# Patient Record
Sex: Female | Born: 1950 | Race: White | Hispanic: No | Smoking: Current some day smoker
Health system: Southern US, Community
[De-identification: ages and names within clinical notes are randomized; demographics above are authoritative.]

## PROBLEM LIST (undated history)

## (undated) DIAGNOSIS — R011 Cardiac murmur, unspecified: Secondary | ICD-10-CM

## (undated) DIAGNOSIS — C4359 Malignant melanoma of other part of trunk: Secondary | ICD-10-CM

## (undated) DIAGNOSIS — I10 Essential (primary) hypertension: Secondary | ICD-10-CM

## (undated) HISTORY — PX: MELANOMA EXCISION: SHX5266

## (undated) HISTORY — PX: EYE SURGERY: SHX253

---

## 2004-05-10 ENCOUNTER — Encounter: Admission: RE | Admit: 2004-05-10 | Discharge: 2004-05-10 | Payer: Self-pay | Admitting: Family Medicine

## 2004-07-04 ENCOUNTER — Encounter: Admission: RE | Admit: 2004-07-04 | Discharge: 2004-07-04 | Payer: Self-pay | Admitting: Family Medicine

## 2007-09-17 ENCOUNTER — Encounter: Admission: RE | Admit: 2007-09-17 | Discharge: 2007-09-17 | Payer: Self-pay | Admitting: Family Medicine

## 2007-09-17 ENCOUNTER — Inpatient Hospital Stay (HOSPITAL_COMMUNITY): Admission: EM | Admit: 2007-09-17 | Discharge: 2007-09-21 | Payer: Self-pay | Admitting: Emergency Medicine

## 2007-09-18 ENCOUNTER — Encounter (INDEPENDENT_AMBULATORY_CARE_PROVIDER_SITE_OTHER): Payer: Self-pay | Admitting: Surgery

## 2010-08-13 NOTE — Op Note (Signed)
[name], Heidi Ramirez NO.:  [id number]   MEDICAL RECORD NO.:  [id number]           PATIENT TYPE:   LOCATION:                                 FACILITY:   PHYSICIAN:  Wilmon Arms. Corliss Skains, M.D. DATE OF BIRTH:  10-Oct-1950   DATE OF PROCEDURE:  09/18/2007  DATE OF DISCHARGE:                               OPERATIVE REPORT   PREOP DIAGNOSIS:  Acute perforated appendicitis.   POSTOPERATIVE DIAGNOSIS:  Acute perforated appendicitis.   PROCEDURE PERFORMED:  Laparoscopic appendectomy.   SURGEON:  Wilmon Arms. Tsuei MD   ANESTHESIA:  General.   INDICATIONS:  The patient is a 60 year old female who presents with a 3-  day history of nausea, vomiting, and right lower quadrant pain.  She did  have some fever, but has deprogressed.  She was sent by her primary care  doctor to palpation imaging where a CT scan showed a ruptured appendix.  We were consulted and recommended immediate laparoscopic appendectomy,  possible open appendectomy.   DESCRIPTION OF PROCEDURE:  The patient was brought to the operating  room, placed in the supine position on operating room table.  After an  adequate level of general anesthesia was obtained, a Foley catheter was  placed under sterile technique.  The patient's abdomen was prepped with  Betadine, draped in sterile fashion.  A time out was taken to ensure the  proper patient, proper procedure.  We made a small vertical midline  incision just below her umbilicus.  Dissection was carried down to the  fascia which was opened vertically.  The peritoneum was bluntly entered.  A stay suture of 0 Vicryl was placed under fascial opening.  The Hasson  cannula was inserted and secured with a stay suture.  Pneumoperitoneum  was obtained by insufflating CO2 maintaining maximal pressure of 15  mmHg.  We inserted the laparoscope.  The patient has a previous right  subcostal incision, but has no intra-abdominal adhesions in this area.  A 5-mm port was placed  in the right upper quadrant.  Another 5 mm port  was placed in the left lower quadrant.  There were some purulent seen in  the pelvis.  This was suctioned out.  The patient was positioned in  Trendelenburg tilted to her left.  We identified the cecum which  appeared normal.  We could identify a large inflamed necrotic appendix  heading straight down into the pelvis.  We were able to bluntly  dissected a tip of the appendix free and elevated.  However, there was  already a large hole in the side of the appendix which was draining some  purulent fluid as well as stool.  Difficulty grasping the appendix due  to its necrotic nature.  Therefore, I began dissecting at the base of  the appendix, which appeared grossly normal.  We created a window around  the appendix.  The base of the appendix was then divided with EndoGIA  stapler blue load.  We then divided the mesoappendix with the harmonic  scalpel.  We were able to then free up the entire appendix and placed in  an EndoCatch sac.  The appendix and sac were then removed to the  umbilical port site.  We then spent some time irrigating the pelvis and  right lower quadrant with 3 full liters of normal saline.  There was a  little bit of oozing at the staple line which was treated with cautery.  The fallopian tube was identified and was inflamed, but no gross  purulence was noted.  The uterus appeared grossly normal.  We suctioned  as much irrigation as possible including up over the liver.  Pneumoperitoneum was then released as we removed the ports.  The stay  suture was used to close  the umbilical fascia.  4-0 Monocryl was used to close the skin  incisions.  Steri-Strips and clean dressings were applied.  The patient  was extubated and brought to recovery in stable condition.  All sponge,  instrument, needle counts correct.      Wilmon Arms. Tsuei, M.D.  Electronically Signed     MKT/MEDQ  D:  09/18/2007  T:  09/18/2007  Job:  [id number]

## 2010-08-16 NOTE — Discharge Summary (Signed)
NAMETHERSEA, MANFREDONIA               ACCOUNT NO.:  [id number]   MEDICAL RECORD NO.:  [id number]          PATIENT TYPE:  INP   LOCATION:  6741                         FACILITY:  [facility]   PHYSICIAN:  Gabrielle Dare. Janee Morn, M.D.DATE OF BIRTH:  [birth year]   DATE OF ADMISSION:  09/17/2007  DATE OF DISCHARGE:  09/21/2007                               DISCHARGE SUMMARY   DISCHARGING PHYSICIAN:  Gabrielle Dare. Janee Morn, MD   CHIEF COMPLAINT AND REASON FOR ADMISSION:  Ms. Goding is a 60 year old  female patient with no significant medical problems, who presented to  the ER with 3 days of nausea and vomiting, 2 days of right lower  quadrant pain, saw her primary care physician initially, and was sent to  [facility] where CT revealed a ruptured appendix.  She presented  to the ER.  She had also been having fevers and nausea and vomiting.  On  clinical exam, she was tender in the right lower quadrant.  Her white  count was 10,600.  Her CT scan was reviewed by Dr. Corliss Skains and was  confirmed as being a perforated appendix.  The patient was admitted with  the following diagnoses:  1. Acute abdominal pain secondary to perforated appendix.   HOSPITAL COURSE:  The patient was admitted and taken directly to the OR  from the ER where she underwent a laparoscopic appendectomy for  perforated appendix.  She was found to have a necrotic appendix.  She  was sent back to the general medical floor to cover with IV fluids,  careful monitoring, and return of bowel function and empiric antibiotic  therapy with Zosyn IV.  Over the first few postoperative days, the  patient had issues related to pain control and ileus symptoms.  She was  also splinting and was requiring oxygen in the initial 24-hour period.   On postop day #2, the patient was afebrile, vital signs were stable.  She was sating 94% on room air.  She was having liquid BMs and some  emesis.  This was actually related to the Percocet she has had the day  before.  This Percocet was subsequently changed to Vicodin and IV  Toradol with Protonix was added.  She was continued on her Zosyn.  Her  abdomen was soft and distended, but bowel sounds were noted.  She was  minimally tender in the right lower quadrant.  By postop day #3, the  patient had markedly improved, her white count was normal at 9600, her  vital signs were stable, she had been passing stools which were normal  in appearance.  She was tolerating clears.  Her abdomen was distended,  but bowel sounds were present.  She was mildly tender in all quadrants.  She was advanced to a solid diet, converted over to oral antibiotics,  and converted from Toradol to ibuprofen, and plans were if she was doing  well in the afternoon that she would be discharged home.   FINAL DISCHARGE DIAGNOSES:  1. Acute appendicitis, perforated.  2. Mild postoperative ileus, resolved.   DISCHARGE MEDICATIONS:  1. Continue the following home medications, Klonopin  1 tablet as      previously b.i.d.  2. New medications, Vicodin 5/325, 1-2 tablets every 4 hours as needed      for pain.  3. Ibuprofen 600 mg t.i.d. as needed for pain.  4. Augmentin 875 mg twice daily for 7 days for infection post      appendectomy.   Return to work in 2 weeks, note given.  Diet, no restrictions.  Wound  care, remove bandages that were left in place tomorrow and if Steri-  Strips were placed under the bandages, please allow these to fall off.  Activity, increase activity slowly.  May walk up steps.  May shower.  No  lifting greater than 10 pounds for the next 2 weeks.  No driving for 1  week.  Other instructions, she is to call the surgeon's office if,  a.  Fever greater than or equal to 101.5 degrees Fahrenheit.  b.  New or increased belly pain.  c.  Nausea, vomiting, diarrhea or if she cannot have a bowel movement.  d.  Redness or drainage from the wounds.   FOLLOWUP APPOINTMENTS:  She needs to see Dr. Corliss Skains in the office  in 1-2  weeks.  She needs to call to arrange an appointment.  The telephone  number is [phone].       Allison L. Rennis Harding, N.P.      Gabrielle Dare Janee Morn, M.D.  Electronically Signed    ALE/MEDQ  D:  10/20/2007  T:  10/21/2007  Job:  [id number]   cc:   Wilmon Arms. Tsuei, M.D.

## 2010-12-26 LAB — ABO/RH: ABO/RH(D): A NEG

## 2010-12-26 LAB — BASIC METABOLIC PANEL WITH GFR
BUN: 7
GFR calc non Af Amer: >60
Glucose, Bld: 103 — ABNORMAL HIGH
Potassium: 3.6

## 2010-12-26 LAB — DIFFERENTIAL
Basophils Absolute: 0
Basophils Relative: 0
Basophils Relative: 0
Eosinophils Absolute: 0
Eosinophils Absolute: 0.4
Eosinophils Relative: 0
Eosinophils Relative: 4
Lymphocytes Relative: 7 — ABNORMAL LOW
Lymphs Abs: 0.7
Lymphs Abs: 0.9
Monocytes Absolute: 1
Monocytes Absolute: 1.3 — ABNORMAL HIGH
Monocytes Relative: 10
Monocytes Relative: 14 — ABNORMAL HIGH
Neutro Abs: 8.8 — ABNORMAL HIGH
Neutrophils Relative %: 83 — ABNORMAL HIGH

## 2010-12-26 LAB — BASIC METABOLIC PANEL
BUN: 14
CO2: 27
CO2: 27
Calcium: 7.8 — ABNORMAL LOW
Calcium: 9
Chloride: 106
Chloride: 96
Creatinine, Ser: 0.65
Creatinine, Ser: 0.75
GFR calc Af Amer: >60
GFR calc Af Amer: >60
GFR calc non Af Amer: >60
Glucose, Bld: 106 — ABNORMAL HIGH
Potassium: 3.6
Sodium: 134 — ABNORMAL LOW
Sodium: 141

## 2010-12-26 LAB — CBC
HCT: 38.3
HCT: 38.9
HCT: 41.3
Hemoglobin: 13
Hemoglobin: 14
MCHC: 33.3
MCHC: 33.3
MCHC: 33.8
MCV: 90.9
MCV: 92.4
MCV: 92.6
Platelets: 363
Platelets: 379
RBC: 4.14
RBC: 4.21
RBC: 4.55
RDW: 13.1
RDW: 13.3
WBC: 10.6 — ABNORMAL HIGH
WBC: 8
WBC: 9.6

## 2010-12-26 LAB — APTT: aPTT: 35

## 2010-12-26 LAB — PROTIME-INR
INR: 1
Prothrombin Time: 13.8

## 2010-12-26 LAB — POCT CARDIAC MARKERS
CKMB, poc: 2.1
Myoglobin, poc: 305
Operator id: 234501
Troponin i, poc: <0.05

## 2010-12-26 LAB — TYPE AND SCREEN
ABO/RH(D): A NEG
Antibody Screen: NEGATIVE

## 2013-01-06 ENCOUNTER — Other Ambulatory Visit: Payer: Self-pay | Admitting: Family Medicine

## 2013-01-06 ENCOUNTER — Other Ambulatory Visit (HOSPITAL_COMMUNITY)
Admission: RE | Admit: 2013-01-06 | Discharge: 2013-01-06 | Disposition: A | Discharge status: Home / Self Care | Payer: BC Managed Care – PPO | Source: Ambulatory Visit | Attending: Family Medicine | Admitting: Family Medicine

## 2013-01-06 DIAGNOSIS — Z1151 Encounter for screening for human papillomavirus (HPV): Secondary | ICD-10-CM | POA: Insufficient documentation

## 2013-01-06 DIAGNOSIS — Z124 Encounter for screening for malignant neoplasm of cervix: Secondary | ICD-10-CM | POA: Insufficient documentation

## 2014-02-13 ENCOUNTER — Other Ambulatory Visit: Payer: Self-pay

## 2016-03-10 ENCOUNTER — Other Ambulatory Visit: Payer: Self-pay | Admitting: Family Medicine

## 2016-03-10 ENCOUNTER — Other Ambulatory Visit (HOSPITAL_COMMUNITY)
Admission: RE | Admit: 2016-03-10 | Discharge: 2016-03-10 | Disposition: A | Discharge status: Home / Self Care | Payer: BLUE CROSS/BLUE SHIELD | Source: Ambulatory Visit | Attending: Family Medicine | Admitting: Family Medicine

## 2016-03-10 DIAGNOSIS — Z01411 Encounter for gynecological examination (general) (routine) with abnormal findings: Secondary | ICD-10-CM | POA: Insufficient documentation

## 2016-03-11 LAB — CYTOLOGY - PAP: Diagnosis: NEGATIVE

## 2017-01-02 DIAGNOSIS — Z1211 Encounter for screening for malignant neoplasm of colon: Secondary | ICD-10-CM | POA: Diagnosis not present

## 2017-01-02 DIAGNOSIS — J441 Chronic obstructive pulmonary disease with (acute) exacerbation: Secondary | ICD-10-CM | POA: Diagnosis not present

## 2017-01-02 DIAGNOSIS — Z23 Encounter for immunization: Secondary | ICD-10-CM | POA: Diagnosis not present

## 2017-01-12 DIAGNOSIS — Z1211 Encounter for screening for malignant neoplasm of colon: Secondary | ICD-10-CM | POA: Diagnosis not present

## 2017-01-20 DIAGNOSIS — J441 Chronic obstructive pulmonary disease with (acute) exacerbation: Secondary | ICD-10-CM | POA: Diagnosis not present

## 2017-04-03 DIAGNOSIS — Z79899 Other long term (current) drug therapy: Secondary | ICD-10-CM | POA: Diagnosis not present

## 2017-04-03 DIAGNOSIS — E559 Vitamin D deficiency, unspecified: Secondary | ICD-10-CM | POA: Diagnosis not present

## 2017-04-03 DIAGNOSIS — Z23 Encounter for immunization: Secondary | ICD-10-CM | POA: Diagnosis not present

## 2017-04-03 DIAGNOSIS — I517 Cardiomegaly: Secondary | ICD-10-CM | POA: Diagnosis not present

## 2017-04-03 DIAGNOSIS — Z0000 Encounter for general adult medical examination without abnormal findings: Secondary | ICD-10-CM | POA: Diagnosis not present

## 2017-04-03 DIAGNOSIS — E7800 Pure hypercholesterolemia, unspecified: Secondary | ICD-10-CM | POA: Diagnosis not present

## 2017-04-03 DIAGNOSIS — J449 Chronic obstructive pulmonary disease, unspecified: Secondary | ICD-10-CM | POA: Diagnosis not present

## 2017-04-03 DIAGNOSIS — I491 Atrial premature depolarization: Secondary | ICD-10-CM | POA: Diagnosis not present

## 2017-04-03 DIAGNOSIS — C439 Malignant melanoma of skin, unspecified: Secondary | ICD-10-CM | POA: Diagnosis not present

## 2017-04-03 DIAGNOSIS — R011 Cardiac murmur, unspecified: Secondary | ICD-10-CM | POA: Diagnosis not present

## 2017-04-03 DIAGNOSIS — I10 Essential (primary) hypertension: Secondary | ICD-10-CM | POA: Diagnosis not present

## 2017-06-08 ENCOUNTER — Other Ambulatory Visit: Payer: Self-pay

## 2017-06-08 DIAGNOSIS — D485 Neoplasm of uncertain behavior of skin: Secondary | ICD-10-CM | POA: Diagnosis not present

## 2017-06-08 DIAGNOSIS — L570 Actinic keratosis: Secondary | ICD-10-CM | POA: Diagnosis not present

## 2017-06-08 DIAGNOSIS — D225 Melanocytic nevi of trunk: Secondary | ICD-10-CM | POA: Diagnosis not present

## 2017-06-08 DIAGNOSIS — D229 Melanocytic nevi, unspecified: Secondary | ICD-10-CM | POA: Diagnosis not present

## 2017-07-09 DIAGNOSIS — H35341 Macular cyst, hole, or pseudohole, right eye: Secondary | ICD-10-CM | POA: Diagnosis not present

## 2017-07-09 DIAGNOSIS — H43812 Vitreous degeneration, left eye: Secondary | ICD-10-CM | POA: Diagnosis not present

## 2017-07-09 DIAGNOSIS — Z01818 Encounter for other preprocedural examination: Secondary | ICD-10-CM | POA: Diagnosis not present

## 2017-07-14 DIAGNOSIS — H33311 Horseshoe tear of retina without detachment, right eye: Secondary | ICD-10-CM | POA: Diagnosis not present

## 2017-07-14 DIAGNOSIS — H35349 Macular cyst, hole, or pseudohole, unspecified eye: Secondary | ICD-10-CM | POA: Diagnosis not present

## 2017-07-14 DIAGNOSIS — H35341 Macular cyst, hole, or pseudohole, right eye: Secondary | ICD-10-CM | POA: Diagnosis not present

## 2017-08-10 DIAGNOSIS — D229 Melanocytic nevi, unspecified: Secondary | ICD-10-CM | POA: Diagnosis not present

## 2017-08-10 DIAGNOSIS — L570 Actinic keratosis: Secondary | ICD-10-CM | POA: Diagnosis not present

## 2017-09-08 DIAGNOSIS — Z01818 Encounter for other preprocedural examination: Secondary | ICD-10-CM | POA: Diagnosis not present

## 2017-09-08 DIAGNOSIS — H2512 Age-related nuclear cataract, left eye: Secondary | ICD-10-CM | POA: Diagnosis not present

## 2017-09-08 DIAGNOSIS — H2511 Age-related nuclear cataract, right eye: Secondary | ICD-10-CM | POA: Diagnosis not present

## 2017-09-18 DIAGNOSIS — H2511 Age-related nuclear cataract, right eye: Secondary | ICD-10-CM | POA: Diagnosis not present

## 2017-09-18 DIAGNOSIS — H25811 Combined forms of age-related cataract, right eye: Secondary | ICD-10-CM | POA: Diagnosis not present

## 2018-01-11 DIAGNOSIS — D229 Melanocytic nevi, unspecified: Secondary | ICD-10-CM | POA: Diagnosis not present

## 2018-01-11 DIAGNOSIS — Z85820 Personal history of malignant melanoma of skin: Secondary | ICD-10-CM | POA: Diagnosis not present

## 2018-01-11 DIAGNOSIS — L570 Actinic keratosis: Secondary | ICD-10-CM | POA: Diagnosis not present

## 2018-02-11 DIAGNOSIS — L570 Actinic keratosis: Secondary | ICD-10-CM | POA: Diagnosis not present

## 2018-03-17 DIAGNOSIS — J449 Chronic obstructive pulmonary disease, unspecified: Secondary | ICD-10-CM | POA: Diagnosis not present

## 2018-03-17 DIAGNOSIS — J069 Acute upper respiratory infection, unspecified: Secondary | ICD-10-CM | POA: Diagnosis not present

## 2018-03-25 ENCOUNTER — Emergency Department (HOSPITAL_COMMUNITY): Payer: Medicare Other

## 2018-03-25 ENCOUNTER — Encounter (HOSPITAL_COMMUNITY): Payer: Self-pay | Admitting: Internal Medicine

## 2018-03-25 ENCOUNTER — Other Ambulatory Visit: Payer: Self-pay

## 2018-03-25 ENCOUNTER — Observation Stay (HOSPITAL_COMMUNITY)
Admission: EM | Admit: 2018-03-25 | Discharge: 2018-03-28 | Disposition: A | Discharge status: Home / Self Care | Payer: Medicare Other | Attending: Internal Medicine | Admitting: Internal Medicine

## 2018-03-25 DIAGNOSIS — J441 Chronic obstructive pulmonary disease with (acute) exacerbation: Secondary | ICD-10-CM | POA: Diagnosis present

## 2018-03-25 DIAGNOSIS — J440 Chronic obstructive pulmonary disease with acute lower respiratory infection: Secondary | ICD-10-CM | POA: Diagnosis not present

## 2018-03-25 DIAGNOSIS — J9601 Acute respiratory failure with hypoxia: Secondary | ICD-10-CM | POA: Insufficient documentation

## 2018-03-25 DIAGNOSIS — R011 Cardiac murmur, unspecified: Secondary | ICD-10-CM | POA: Diagnosis not present

## 2018-03-25 DIAGNOSIS — R05 Cough: Secondary | ICD-10-CM | POA: Diagnosis not present

## 2018-03-25 DIAGNOSIS — R062 Wheezing: Secondary | ICD-10-CM | POA: Diagnosis not present

## 2018-03-25 DIAGNOSIS — I10 Essential (primary) hypertension: Secondary | ICD-10-CM | POA: Diagnosis not present

## 2018-03-25 DIAGNOSIS — I700 Atherosclerosis of aorta: Secondary | ICD-10-CM | POA: Insufficient documentation

## 2018-03-25 DIAGNOSIS — Z7951 Long term (current) use of inhaled steroids: Secondary | ICD-10-CM | POA: Insufficient documentation

## 2018-03-25 DIAGNOSIS — IMO0001 Reserved for inherently not codable concepts without codable children: Secondary | ICD-10-CM | POA: Diagnosis present

## 2018-03-25 DIAGNOSIS — Z23 Encounter for immunization: Secondary | ICD-10-CM | POA: Diagnosis not present

## 2018-03-25 DIAGNOSIS — J209 Acute bronchitis, unspecified: Secondary | ICD-10-CM

## 2018-03-25 DIAGNOSIS — R0902 Hypoxemia: Secondary | ICD-10-CM | POA: Diagnosis not present

## 2018-03-25 DIAGNOSIS — Z85820 Personal history of malignant melanoma of skin: Secondary | ICD-10-CM | POA: Diagnosis not present

## 2018-03-25 DIAGNOSIS — Z79899 Other long term (current) drug therapy: Secondary | ICD-10-CM | POA: Diagnosis not present

## 2018-03-25 DIAGNOSIS — R0602 Shortness of breath: Secondary | ICD-10-CM | POA: Diagnosis not present

## 2018-03-25 DIAGNOSIS — R000 Tachycardia, unspecified: Secondary | ICD-10-CM | POA: Diagnosis not present

## 2018-03-25 DIAGNOSIS — F17200 Nicotine dependence, unspecified, uncomplicated: Secondary | ICD-10-CM | POA: Diagnosis present

## 2018-03-25 DIAGNOSIS — J449 Chronic obstructive pulmonary disease, unspecified: Secondary | ICD-10-CM | POA: Diagnosis present

## 2018-03-25 HISTORY — DX: Malignant melanoma of other part of trunk: C43.59

## 2018-03-25 HISTORY — DX: Essential (primary) hypertension: I10

## 2018-03-25 HISTORY — DX: Cardiac murmur, unspecified: R01.1

## 2018-03-25 LAB — CBC WITH DIFFERENTIAL/PLATELET
ABS IMMATURE GRANULOCYTES: 0.03 10*3/uL (ref 0.00–0.07)
BASOS ABS: 0.1 10*3/uL (ref 0.0–0.1)
Basophils Relative: 1 %
EOS ABS: 0.3 10*3/uL (ref 0.0–0.5)
Eosinophils Relative: 3 %
HCT: 47.8 % — ABNORMAL HIGH (ref 36.0–46.0)
Hemoglobin: 15.1 g/dL — ABNORMAL HIGH (ref 12.0–15.0)
IMMATURE GRANULOCYTES: 0 %
Lymphocytes Relative: 8 %
Lymphs Abs: 0.8 10*3/uL (ref 0.7–4.0)
MCH: 29.9 pg (ref 26.0–34.0)
MCHC: 31.6 g/dL (ref 30.0–36.0)
MCV: 94.7 fL (ref 80.0–100.0)
MONOS PCT: 5 %
Monocytes Absolute: 0.5 10*3/uL (ref 0.1–1.0)
NEUTROS ABS: 9.2 10*3/uL — AB (ref 1.7–7.7)
NEUTROS PCT: 83 %
NRBC: 0 % (ref 0.0–0.2)
PLATELETS: 436 10*3/uL — AB (ref 150–400)
RBC: 5.05 MIL/uL (ref 3.87–5.11)
RDW: 13 % (ref 11.5–15.5)
WBC: 11 10*3/uL — ABNORMAL HIGH (ref 4.0–10.5)

## 2018-03-25 LAB — BASIC METABOLIC PANEL
Anion gap: 11 (ref 5–15)
BUN: 10 mg/dL (ref 8–23)
CO2: 24 mmol/L (ref 22–32)
Calcium: 9.4 mg/dL (ref 8.9–10.3)
Chloride: 104 mmol/L (ref 98–111)
Creatinine, Ser: 0.83 mg/dL (ref 0.44–1.00)
GFR calc non Af Amer: >60 mL/min (ref >60)
GLUCOSE: 108 mg/dL — AB (ref 70–99)
Potassium: 4.4 mmol/L (ref 3.5–5.1)
Sodium: 139 mmol/L (ref 135–145)

## 2018-03-25 LAB — BRAIN NATRIURETIC PEPTIDE: B Natriuretic Peptide: 84.5 pg/mL (ref 0.0–100.0)

## 2018-03-25 LAB — INFLUENZA PANEL BY PCR (TYPE A & B)
INFLAPCR: NEGATIVE
Influenza B By PCR: NEGATIVE

## 2018-03-25 LAB — TROPONIN I

## 2018-03-25 MED ORDER — ENOXAPARIN SODIUM 40 MG/0.4ML ~~LOC~~ SOLN
40.0000 mg | SUBCUTANEOUS | Status: DC
  Administered 2018-03-25 – 2018-03-27 (×3): 40 mg via SUBCUTANEOUS
  Filled 2018-03-25 (×3): qty 0.4

## 2018-03-25 MED ORDER — INFLUENZA VAC SPLIT HIGH-DOSE 0.5 ML IM SUSY
0.5000 mL | PREFILLED_SYRINGE | INTRAMUSCULAR | Status: AC
  Administered 2018-03-26: 0.5 mL via INTRAMUSCULAR
  Filled 2018-03-25: qty 0.5

## 2018-03-25 MED ORDER — MOMETASONE FURO-FORMOTEROL FUM 200-5 MCG/ACT IN AERO
1.0000 | INHALATION_SPRAY | Freq: Two times a day (BID) | RESPIRATORY_TRACT | Status: DC
  Administered 2018-03-26: 1 via RESPIRATORY_TRACT
  Filled 2018-03-25: qty 8.8

## 2018-03-25 MED ORDER — MAGNESIUM SULFATE 2 GM/50ML IV SOLN
2.0000 g | Freq: Once | INTRAVENOUS | Status: AC
  Administered 2018-03-25: 2 g via INTRAVENOUS
  Filled 2018-03-25: qty 50

## 2018-03-25 MED ORDER — ALBUTEROL SULFATE (2.5 MG/3ML) 0.083% IN NEBU: 2.5000 mg | INHALATION_SOLUTION | RESPIRATORY_TRACT | Status: DC | PRN

## 2018-03-25 MED ORDER — SODIUM CHLORIDE 0.9 % IV BOLUS
500.0000 mL | Freq: Once | INTRAVENOUS | Status: AC
  Administered 2018-03-25: 500 mL via INTRAVENOUS

## 2018-03-25 MED ORDER — IPRATROPIUM BROMIDE 0.02 % IN SOLN
0.5000 mg | Freq: Four times a day (QID) | RESPIRATORY_TRACT | Status: DC
  Administered 2018-03-25: 0.5 mg via RESPIRATORY_TRACT
  Filled 2018-03-25: qty 2.5

## 2018-03-25 MED ORDER — DOXYCYCLINE HYCLATE 100 MG PO TABS
100.0000 mg | ORAL_TABLET | Freq: Once | ORAL | Status: AC
  Administered 2018-03-25: 100 mg via ORAL
  Filled 2018-03-25: qty 1

## 2018-03-25 MED ORDER — AMLODIPINE BESYLATE 5 MG PO TABS: 2.5000 mg | ORAL_TABLET | Freq: Every day | ORAL | Status: DC

## 2018-03-25 MED ORDER — ALBUTEROL SULFATE (2.5 MG/3ML) 0.083% IN NEBU
5.0000 mg | INHALATION_SOLUTION | Freq: Once | RESPIRATORY_TRACT | Status: AC
  Administered 2018-03-25: 5 mg via RESPIRATORY_TRACT
  Filled 2018-03-25: qty 6

## 2018-03-25 MED ORDER — ALBUTEROL (5 MG/ML) CONTINUOUS INHALATION SOLN
10.0000 mg/h | INHALATION_SOLUTION | Freq: Once | RESPIRATORY_TRACT | Status: AC
  Administered 2018-03-25: 10 mg/h via RESPIRATORY_TRACT
  Filled 2018-03-25: qty 20

## 2018-03-25 MED ORDER — IPRATROPIUM BROMIDE 0.02 % IN SOLN
0.5000 mg | Freq: Three times a day (TID) | RESPIRATORY_TRACT | Status: DC
  Administered 2018-03-26 – 2018-03-28 (×7): 0.5 mg via RESPIRATORY_TRACT
  Filled 2018-03-25 (×7): qty 2.5

## 2018-03-25 MED ORDER — PREDNISONE 20 MG PO TABS
40.0000 mg | ORAL_TABLET | Freq: Every day | ORAL | Status: DC
  Administered 2018-03-26 – 2018-03-28 (×3): 40 mg via ORAL
  Filled 2018-03-25 (×3): qty 2

## 2018-03-25 MED ORDER — DOXYCYCLINE HYCLATE 100 MG PO TABS
100.0000 mg | ORAL_TABLET | Freq: Two times a day (BID) | ORAL | Status: DC
  Administered 2018-03-26 – 2018-03-28 (×5): 100 mg via ORAL
  Filled 2018-03-25 (×5): qty 1

## 2018-03-25 MED ORDER — AMLODIPINE BESYLATE 5 MG PO TABS
2.5000 mg | ORAL_TABLET | Freq: Every day | ORAL | Status: DC
  Administered 2018-03-26 – 2018-03-28 (×3): 2.5 mg via ORAL
  Filled 2018-03-25 (×3): qty 1

## 2018-03-25 NOTE — ED Notes (Signed)
Pt ambulated in hallway without oxygen, pt became short of breath and oxygen saturation level went as low as 86%. Courtni PA at notified and pt placed on 2 L [place].

## 2018-03-25 NOTE — ED Notes (Signed)
Attempted report 

## 2018-03-25 NOTE — ED Triage Notes (Signed)
Pt here with c/o sob and CP.  Sent from PCP for further evaluation.

## 2018-03-25 NOTE — ED Provider Notes (Signed)
[facility] EMERGENCY DEPARTMENT Provider Note   CSN: [id number] Arrival date & time: 03/25/18  1416     History   Chief Complaint Chief Complaint  Patient presents with  . Shortness of Breath  . Chest Pain    HPI Heidi Ramirez is a 67 y.o. female.  HPI  Patient is a 67 year old female with history of hypertension, hyperlipidemia, COPD, tobacco use, who presents the emergency department today for evaluation of a cough and shortness of breath that have been present for the last 2 weeks but seem to be worsening over the last 1 to 2 days.  Patient states that she began to have a productive cough about 2 weeks ago.  She was seen at a walk-in clinic and given an inhaler and fluticasone to treat bronchitis.  She had a chest x-ray at that time which did not show evidence of pneumonia.  States that over the past 2 days she has been using her albuterol inhaler every hour without significant relief of her shortness of breath.  Shortness of breath is worse with ambulation and when laying flat.  She denies any significant chest pain.  States that sometimes when she turns to the right she has chest pain that feels like a gas bubble however it resolves when she sits normally.  She denies any pain with inspiration.  Reports wheezing.  No significant fevers or chills.  Has had nasal congestion but no sore throat.  No lower extremity swelling or leg pain.  No recent surgeries, hospital admissions or extended periods of travel.  No history of PE/DVT.  Does have a history of melanoma but no active cancer reported.  No hemoptysis or estrogen therapy.  Prior to arrival she was seen by her PCP in the office and was noted to be hypoxic with an O2 sat of 89% on room air.  She was given 125 Solu-Medrol and 2 DuoNeb treatments without significant improvement.  She was sent here for further evaluation.  Past Medical History:  Diagnosis Date  . 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 COPD with acute bronchitis ([place]) 03/25/2018    OB History   No obstetric history on file.      Home Medications    Prior to Admission medications   Medication Sig Start Date End Date Taking? Authorizing Provider  albuterol (PROVENTIL HFA;VENTOLIN HFA) 108 (90 Base) MCG/ACT inhaler Inhale 2 puffs into the lungs every 6 (six) hours as needed for wheezing or shortness of breath.   Yes [provider]  amLODipine (NORVASC) 2.5 MG tablet Take 2.5 mg by mouth daily.   Yes [provider]  fluticasone (FLONASE) 50 MCG/ACT nasal spray Place 2 sprays into both nostrils daily.   Yes [provider]    Family History No family history on file.  Social History Social History   Tobacco Use  . Smoking status: Not on file  Substance Use Topics  . Alcohol use: Not on file  . Drug use: Not on file     Allergies   Patient has no known allergies.   Review of Systems Review of Systems  Constitutional: Negative for chills and fever.  HENT: Positive for congestion. Negative for ear pain and sore throat.   Eyes: Negative for pain and visual disturbance.  Respiratory: Positive for cough, shortness of breath and wheezing.   Cardiovascular: Positive for chest pain (intermittent, resolved). Negative for leg  swelling.  Gastrointestinal: Negative for abdominal pain, constipation, diarrhea, nausea and vomiting.  Genitourinary: Negative for dysuria and hematuria.  Musculoskeletal: Negative for back pain.  Skin: Negative for rash.  Neurological: Negative for headaches.  All other systems reviewed and are negative.    Physical Exam Updated Vital Signs BP (!) 149/52   Pulse 100   Temp 98.1 F (36.7 C) (Oral)   Resp 16   Ht 5\' 6"  (1.676 m)   Wt 68.9 kg   SpO2 100%   BMI 24.53 kg/m   Physical Exam Vitals signs and nursing note reviewed.  Constitutional:      General: She is not in acute distress.    Appearance: She  is well-developed. She is not ill-appearing.  HENT:     Head: Normocephalic and atraumatic.  Eyes:     Conjunctiva/sclera: Conjunctivae normal.  Neck:     Musculoskeletal: Neck supple.  Cardiovascular:     Rate and Rhythm: Normal rate and regular rhythm.     Heart sounds: Murmur (soft systolic murmur) present.  Pulmonary:     Effort: Pulmonary effort is normal.     Breath sounds: Decreased breath sounds and wheezing present.     Comments: No tachypnea.  Speaking in full sentences. Abdominal:     General: Bowel sounds are normal.     Palpations: Abdomen is soft.     Tenderness: There is no abdominal tenderness.  Musculoskeletal: Normal range of motion.     Right lower leg: She exhibits no tenderness. No edema.     Left lower leg: She exhibits no tenderness. No edema.  Skin:    General: Skin is warm and dry.     Capillary Refill: Capillary refill takes less than 2 seconds.  Neurological:     Mental Status: She is alert.  Psychiatric:        Mood and Affect: Mood normal.      ED Treatments / Results  Labs (all labs ordered are listed, but only abnormal results are displayed) Labs Reviewed  CBC WITH DIFFERENTIAL/PLATELET - Abnormal; Notable for the following components:      Result Value   WBC 11.0 (*)    Hemoglobin 15.1 (*)    HCT 47.8 (*)    Platelets 436 (*)    Neutro Abs 9.2 (*)    All other components within normal limits  BASIC METABOLIC PANEL - Abnormal; Notable for the following components:   Glucose, Bld 108 (*)    All other components within normal limits  BRAIN NATRIURETIC PEPTIDE  TROPONIN I  INFLUENZA PANEL BY PCR (TYPE A & B)    EKG EKG Interpretation  Date/Time:  Thursday March 25 2018 16:14:29 EST Ventricular Rate:  102 PR Interval:  146 QRS Duration: 100 QT Interval:  380 QTC Calculation: 495 R Axis:   28 Text Interpretation:  Sinus tachycardia Biatrial enlargement Borderline repolarization abnormality Borderline prolonged QT interval when  compared to ECG earlier today, no significant changes.  No STEMI Confirmed by Antony Blackbird [phone]) on 03/25/2018 4:18:02 PM   Radiology Dg Chest 2 View  Result Date: 03/25/2018 CLINICAL DATA:  Cough and shortness of breath EXAM: CHEST - 2 VIEW COMPARISON:  March 25, 2018 study obtained earlier in the day FINDINGS: There is no edema or consolidation. Heart size and pulmonary vascularity are normal. No adenopathy. There is aortic atherosclerosis. No evident bone lesions. IMPRESSION: Aortic atherosclerosis.  No edema or consolidation. Aortic Atherosclerosis (ICD10-I70.0). Electronically Signed   By: Lowella Grip III  M.D.   On: 03/25/2018 19:18    Procedures Procedures (including critical care time) CRITICAL CARE Performed by: Rodney Booze   Total critical care time: 38 minutes  Critical care time was exclusive of separately billable procedures and treating other patients.  Critical care was necessary to treat or prevent imminent or life-threatening deterioration.  Critical care was time spent personally by me on the following activities: development of treatment plan with patient and/or surrogate as well as nursing, discussions with consultants, evaluation of patient's response to treatment, examination of patient, obtaining history from patient or surrogate, ordering and performing treatments and interventions, ordering and review of laboratory studies, ordering and review of radiographic studies, pulse oximetry and re-evaluation of patient's condition.   Medications Ordered in ED Medications  albuterol (PROVENTIL,VENTOLIN) solution continuous neb (10 mg/hr Nebulization Given 03/25/18 1545)  doxycycline (VIBRA-TABS) tablet 100 mg (100 mg Oral Given 03/25/18 1737)  albuterol (PROVENTIL) (2.5 MG/3ML) 0.083% nebulizer solution 5 mg (5 mg Nebulization Given 03/25/18 1737)  sodium chloride 0.9 % bolus 500 mL (0 mLs Intravenous Stopped 03/25/18 1812)  magnesium sulfate IVPB 2 g  50 mL (0 g Intravenous Stopped 03/25/18 1843)     Initial Impression / Assessment and Plan / ED Course  I have reviewed the triage vital signs and the nursing notes.  Pertinent labs & imaging results that were available during my care of the patient were reviewed by me and considered in my medical decision making (see chart for details).     Final Clinical Impressions(s) / ED Diagnoses   Final diagnoses:  Hypoxia  COPD exacerbation [facility])   Patient presented the ED today for evaluation of shortness of breath.  She has had URI symptoms for the last 2 weeks however over the last 1 to 2 days she has become more short of breath.  She has been using an albuterol inhaler at home every hour without significant relief of symptoms.  She has not had any known fevers.  On exam she has diffuse wheezing throughout.  She is not tachypneic and is able to speak in full sentences however her O2 sats have been in the low 90s throughout her stay and she has been placed on 1 L of oxygen.  CBC shows a slight leukocytosis at 11 with no significant anemia. BMP with grossly normal electrolytes.  BNP is negative and troponin is negative.   Influenza panel is currently pending.  CXR without evidence of pneumonia ECG with nonspecific ST-T wave changes.  Normal sinus rhythm with no PVCs and possible right atrial enlargement.  Patient was ambulated after continuous nebulizer treatment and albuterol nebulizer treatment and magnesium and her O2 sats dropped to 86% on room air.  She became very tachypneic.  2 L of O2 was applied.  Patient continues to be wheezing after nebulizer treatments given in the ED, as well as Solu-Medrol and 2 DuoNeb's given by PCP prior to arrival.  Feel that she will require admission for her slight hypoxia likely secondary to COPD exacerbation.  Lower suspicion for PE given patient's diffuse wheezing and history of tobacco use and COPD.  7:58 PM CONSULT With hospitalist service, Dr. Alcario Drought,  who accepts patient for admission   ED Discharge Orders    None       Bishop Dublin 03/25/18 1958    Tegeler, Gwenyth Allegra, MD 03/25/18 [phone]

## 2018-03-25 NOTE — H&P (Addendum)
History and Physical    Heidi Ramirez UDJ:[id number] DOB: Jan 25, 1951 DOA: 03/25/2018  PCP: Patient, No Pcp Per  Patient coming from: Home  I have personally briefly reviewed patient's old medical records in [place]  Chief Complaint: SOB  HPI: Heidi Ramirez is a 67 y.o. female with medical history significant of HTN, melanoma s/p resection without known recurrence, smoking.  Never formally diagnosed with COPD previously though suspected on prior CXRs as far back as 2006 due to increased lung volumes it looks like.  Patient presents the emergency department today for evaluation of a cough and shortness of breath that have been present for the last 2 weeks but seem to be worsening over the last 1 to 2 days.  Patient states that she began to have a productive cough about 2 weeks ago.  She was seen at a walk-in clinic and given an inhaler and fluticasone to treat bronchitis.  CXR neg for PNA then.  States that over the past 2 days she has been using her albuterol inhaler every hour without significant relief of her shortness of breath.  Shortness of breath is worse with ambulation and when laying flat.  She denies any significant chest pain.  Does have: Nasal congestion, wheezing.  No: sore throat, fever, leg swelling, h/o DVT/PE.   ED Course: Given solumedrol, duonebs.  O2 sat 86% on RA with ambulation.  Started on doxycycline, hospitalist asked to admit.   Review of Systems: As per HPI otherwise 10 point review of systems negative.   Past Medical History:  Diagnosis Date  . Heart murmur    since birth  . HTN (hypertension)   . Melanoma of trunk [facility])     Past Surgical History:  Procedure Laterality Date  . EYE SURGERY    . MELANOMA EXCISION       reports that she has been smoking cigarettes. She has been smoking about 0.50 packs per day. She does not have any smokeless tobacco history on file. She reports that she does not drink alcohol or use drugs.  No Known  Allergies  Family History  Problem Relation Age of Onset  . COPD Neg Hx   . Lung cancer Neg Hx      Prior to Admission medications   Medication Sig Start Date End Date Taking? Authorizing Provider  albuterol (PROVENTIL HFA;VENTOLIN HFA) 108 (90 Base) MCG/ACT inhaler Inhale 2 puffs into the lungs every 6 (six) hours as needed for wheezing or shortness of breath.   Yes [provider]  amLODipine (NORVASC) 2.5 MG tablet Take 2.5 mg by mouth daily.   Yes [provider]  fluticasone (FLONASE) 50 MCG/ACT nasal spray Place 2 sprays into both nostrils daily.   Yes [provider]    Physical Exam: Vitals:   03/25/18 1715 03/25/18 1730 03/25/18 1745 03/25/18 1800  BP: (!) 147/60 (!) 143/68 (!) 127/58 (!) 149/52  Pulse: 100 98 97 100  Resp: 17 18 17 16   Temp:      TempSrc:      SpO2: 92% 90% 100% 100%  Weight:      Height:        Constitutional: NAD, calm, comfortable Eyes: PERRL, lids and conjunctivae normal ENMT: Mucous membranes are moist. Posterior pharynx clear of any exudate or lesions.Normal dentition.  Neck: normal, supple, no masses, no thyromegaly Respiratory: Few expiratory wheezes, prolonged expiratory phase.  Cardiovascular: Regular rate and rhythm, no murmurs / rubs / gallops. No extremity edema. 2+ pedal  pulses. No carotid bruits.  Abdomen: no tenderness, no masses palpated. No hepatosplenomegaly. Bowel sounds positive.  Musculoskeletal: no clubbing / cyanosis. No joint deformity upper and lower extremities. Good ROM, no contractures. Normal muscle tone.  Skin: no rashes, lesions, ulcers. No induration Neurologic: CN 2-12 grossly intact. Sensation intact, DTR normal. Strength 5/5 in all 4.  Psychiatric: Normal judgment and insight. Alert and oriented x 3. Normal mood.    Labs on Admission: I have personally reviewed following labs and imaging studies  CBC: Recent Labs  Lab 03/25/18 1527  WBC 11.0*  NEUTROABS 9.2*  HGB 15.1*  HCT  47.8*  MCV 94.7  PLT 333*   Basic Metabolic Panel: Recent Labs  Lab 03/25/18 1527  NA 139  K 4.4  CL 104  CO2 24  GLUCOSE 108*  BUN 10  CREATININE 0.83  CALCIUM 9.4   GFR: Estimated Creatinine Clearance: 62.4 mL/min (by C-G formula based on SCr of 0.83 mg/dL). Liver Function Tests: No results for input(s): AST, ALT, ALKPHOS, BILITOT, PROT, ALBUMIN in the last 168 hours. No results for input(s): LIPASE, AMYLASE in the last 168 hours. No results for input(s): AMMONIA in the last 168 hours. Coagulation Profile: No results for input(s): INR, PROTIME in the last 168 hours. Cardiac Enzymes: Recent Labs  Lab 03/25/18 1527  TROPONINI <0.03   BNP (last 3 results) No results for input(s): PROBNP in the last 8760 hours. HbA1C: No results for input(s): HGBA1C in the last 72 hours. CBG: No results for input(s): GLUCAP in the last 168 hours. Lipid Profile: No results for input(s): CHOL, HDL, LDLCALC, TRIG, CHOLHDL, LDLDIRECT in the last 72 hours. Thyroid Function Tests: No results for input(s): TSH, T4TOTAL, FREET4, T3FREE, THYROIDAB in the last 72 hours. Anemia Panel: No results for input(s): VITAMINB12, FOLATE, FERRITIN, TIBC, IRON, RETICCTPCT in the last 72 hours. Urine analysis: No results found for: COLORURINE, APPEARANCEUR, [place], [place], GLUCOSEU, [place], [place], [place], [place], [place], NITRITE, LEUKOCYTESUR  Radiological Exams on Admission: Dg Chest 2 View  Result Date: 03/25/2018 CLINICAL DATA:  Cough and shortness of breath EXAM: CHEST - 2 VIEW COMPARISON:  March 25, 2018 study obtained earlier in the day FINDINGS: There is no edema or consolidation. Heart size and pulmonary vascularity are normal. No adenopathy. There is aortic atherosclerosis. No evident bone lesions. IMPRESSION: Aortic atherosclerosis.  No edema or consolidation. Aortic Atherosclerosis (ICD10-I70.0). Electronically Signed   By: Lowella Grip III M.D.   On: 03/25/2018 19:18     EKG: Independently reviewed.  Assessment/Plan Principal Problem:   COPD with acute bronchitis (HCC) Active Problems:   Hypoxia   HTN (hypertension)   Smoking    1. Acute bronchitis, likely COPD - with new O2 requirement 1. COPD pathway 2. Influenza PCR pending but seems unlikely 3. Doxycycline 4. Scheduled and PRN nebs 5. Prednisone 6. Cont pulse ox 2. Smoking - 1. Counseled to quit 2. Patient declined nicotine patch, chantix, or other meds at this time though I did offer.  She wants to try cold [place]. 3. Also counseled on making her 2 sons smoke out side to avoid second hand exposure 3. HTN - 1. Cont norvasc  DVT prophylaxis: Lovenox Code Status: Full Family Communication: Family at bedside Disposition Plan: Home after admit Consults called: None Admission status: Place in obs, convert to IP if still requiring O2 tomorrow   Etta Quill DO [facility] Pager [phone] Only works nights!  If 7AM-7PM, please contact the primary day team physician taking care of patient  [email or web address] Password [facility]  03/25/2018, 8:31 PM

## 2018-03-26 ENCOUNTER — Encounter (HOSPITAL_COMMUNITY): Payer: Self-pay | Admitting: Family

## 2018-03-26 DIAGNOSIS — J209 Acute bronchitis, unspecified: Secondary | ICD-10-CM | POA: Diagnosis not present

## 2018-03-26 DIAGNOSIS — I10 Essential (primary) hypertension: Secondary | ICD-10-CM | POA: Diagnosis not present

## 2018-03-26 DIAGNOSIS — F17200 Nicotine dependence, unspecified, uncomplicated: Secondary | ICD-10-CM | POA: Diagnosis not present

## 2018-03-26 DIAGNOSIS — J440 Chronic obstructive pulmonary disease with acute lower respiratory infection: Secondary | ICD-10-CM | POA: Diagnosis not present

## 2018-03-26 DIAGNOSIS — R0902 Hypoxemia: Secondary | ICD-10-CM | POA: Diagnosis not present

## 2018-03-26 LAB — HIV ANTIBODY (ROUTINE TESTING W REFLEX): HIV Screen 4th Generation wRfx: NONREACTIVE

## 2018-03-26 MED ORDER — BUDESONIDE 0.25 MG/2ML IN SUSP
0.2500 mg | Freq: Two times a day (BID) | RESPIRATORY_TRACT | Status: DC
  Administered 2018-03-26 – 2018-03-28 (×4): 0.25 mg via RESPIRATORY_TRACT
  Filled 2018-03-26 (×4): qty 2

## 2018-03-26 MED ORDER — GUAIFENESIN-DM 100-10 MG/5ML PO SYRP
5.0000 mL | ORAL_SOLUTION | ORAL | Status: DC | PRN
  Administered 2018-03-26 – 2018-03-28 (×6): 5 mL via ORAL
  Filled 2018-03-26 (×6): qty 5

## 2018-03-26 NOTE — Progress Notes (Signed)
PROGRESS NOTE  Heidi Ramirez [id number]:[id number] DOB: February 17, 1951 DOA: 03/25/2018 PCP: Patient, No Pcp Per   LOS: 0 days   Brief Narrative / Interim history: 67 year old female with longstanding tobacco use, hypertension, melanoma with prior resection, high suspicion of COPD presents to the hospital with cough, shortness of breath persistent for the past couple weeks but suddenly getting worse in the last 1 to 2 days.  She is also been complaining of a productive cough.  Subjective: -Feels slightly improved when compared to yesterday however still significantly dyspneic with just walking to the bathroom and remains hypoxic requiring oxygen.  Had a coughing spell this morning and desat into the low 80s.   Assessment & Plan: Principal Problem:   COPD with acute bronchitis ([place]) Active Problems:   Hypoxia   HTN (hypertension)   Smoking   Principal Problem Acute hypoxic respiratory failure in the setting of COPD exacerbation -Patient started on steroids, nebulizers, empiric doxycycline, continue -Wean off to room air if tolerated, suspect may need home O2 -Not formally diagnosed with COPD but highly suspicious based on chest x-ray and physical exam as well as longstanding tobacco use  Additional Problems Hypertension -Continue Norvasc  Tobacco use -Strongly counseled for cessation,  Scheduled Meds: . amLODipine  2.5 mg Oral Daily  . budesonide (PULMICORT) nebulizer solution  0.25 mg Nebulization BID  . doxycycline  100 mg Oral Q12H  . enoxaparin (LOVENOX) injection  40 mg Subcutaneous Q24H  . ipratropium  0.5 mg Nebulization TID  . predniSONE  40 mg Oral Q breakfast   Continuous Infusions: PRN Meds:.albuterol, guaiFENesin-dextromethorphan  DVT prophylaxis: Lovenox Code Status: Full code Family Communication: Son present at bedside Disposition Plan: Home when improved  Consultants:   None  Procedures:   None   Antimicrobials:  Doxycycline 12/26 >>    Objective: Vitals:   03/25/18 2341 03/26/18 0740 03/26/18 0829 03/26/18 0938  BP: 137/78 (!) 145/64    Pulse: 93 73    Resp: 17 15    Temp: 98.2 F (36.8 C) 98.4 F (36.9 C)    TempSrc: Oral Oral    SpO2: 94% 96% 97% 98%  Weight:      Height:        Intake/Output Summary (Last 24 hours) at 03/26/2018 1212 Last data filed at 03/25/2018 1812 Gross per 24 hour  Intake 500 ml  Output -  Net 500 ml   Filed Weights   03/25/18 1439 03/25/18 2158  Weight: 68.9 kg 69.6 kg    Examination:  Constitutional: NAD Eyes: PERRL, lids and conjunctivae normal ENMT: Mucous membranes are moist.  Respiratory: End expiratory wheezing, decreased sounds at the bases, no crackles heard.  Increased respiratory effort Cardiovascular: Regular rate and rhythm, no murmurs / rubs / gallops. No LE edema.  Abdomen: no tenderness. Bowel sounds positive.  Musculoskeletal: no clubbing / cyanosis.  Skin: no rashes Neurologic: CN 2-12 grossly intact. Strength 5/5 in all 4.  Psychiatric: Normal judgment and insight. Alert and oriented x 3. Normal mood.    Data Reviewed: I have independently reviewed following labs and imaging studies  CXR -lungs appear hyperinflated consistent with probable COPD  CBC: Recent Labs  Lab 03/25/18 1527  WBC 11.0*  NEUTROABS 9.2*  HGB 15.1*  HCT 47.8*  MCV 94.7  PLT 326*   Basic Metabolic Panel: Recent Labs  Lab 03/25/18 1527  NA 139  K 4.4  CL 104  CO2 24  GLUCOSE 108*  BUN 10  CREATININE 0.83  CALCIUM 9.4  GFR: Estimated Creatinine Clearance: 62.4 mL/min (by C-G formula based on SCr of 0.83 mg/dL). Liver Function Tests: No results for input(s): AST, ALT, ALKPHOS, BILITOT, PROT, ALBUMIN in the last 168 hours. No results for input(s): LIPASE, AMYLASE in the last 168 hours. No results for input(s): AMMONIA in the last 168 hours. Coagulation Profile: No results for input(s): INR, PROTIME in the last 168 hours. Cardiac Enzymes: Recent Labs  Lab  03/25/18 1527  TROPONINI <0.03   BNP (last 3 results) No results for input(s): PROBNP in the last 8760 hours. HbA1C: No results for input(s): HGBA1C in the last 72 hours. CBG: No results for input(s): GLUCAP in the last 168 hours. Lipid Profile: No results for input(s): CHOL, HDL, LDLCALC, TRIG, CHOLHDL, LDLDIRECT in the last 72 hours. Thyroid Function Tests: No results for input(s): TSH, T4TOTAL, FREET4, T3FREE, THYROIDAB in the last 72 hours. Anemia Panel: No results for input(s): VITAMINB12, FOLATE, FERRITIN, TIBC, IRON, RETICCTPCT in the last 72 hours. Urine analysis: No results found for: COLORURINE, APPEARANCEUR, LABSPEC, PHURINE, GLUCOSEU, HGBUR, BILIRUBINUR, KETONESUR, PROTEINUR, UROBILINOGEN, NITRITE, LEUKOCYTESUR Sepsis Labs: Invalid input(s): PROCALCITONIN, LACTICIDVEN  No results found for this or any previous visit (from the past 240 hour(s)).    Radiology Studies: Dg Chest 2 View  Result Date: 03/25/2018 CLINICAL DATA:  Cough and shortness of breath EXAM: CHEST - 2 VIEW COMPARISON:  March 25, 2018 study obtained earlier in the day FINDINGS: There is no edema or consolidation. Heart size and pulmonary vascularity are normal. No adenopathy. There is aortic atherosclerosis. No evident bone lesions. IMPRESSION: Aortic atherosclerosis.  No edema or consolidation. Aortic Atherosclerosis (ICD10-I70.0). Electronically Signed   By: Lowella Grip III M.D.   On: 03/25/2018 19:18    Marzetta Board, MD, PhD [facility] Pager [phone]  If 7PM-7AM, please contact night-coverage [email or web address] Password [email or web address] 03/26/2018, 12:12 PM

## 2018-03-26 NOTE — Progress Notes (Signed)
Pt assisted to sit on edge of bed. O2 saturation 97% on room air. Pt began to have coughing spell. Non-productive, congested cough. O2 went as low as 86% on room air. Pt placed on 2L nasal cannula and O2 saturation improved to 93%.   Fritz Pickerel, RN

## 2018-03-26 NOTE — Progress Notes (Signed)
Physical Therapy Evaluation Patient Details Name: Heidi Ramirez MRN: [id number] DOB: 1950/11/15 Today's Date: 03/26/2018   History of Present Illness  67 y.o. female admitted with acute bronchitis, thought to be COPD. PMH includes: smoking, HTN,   Clinical Impression  Pt admitted with above diagnosis. Pt currently with functional limitations due to the deficits listed below (see PT Problem List). PTA, pt living with family independent with all mobility, no home O2, denies falls. Today patient ambulating well without assistance, desats on RA to 88%, returns to low 90's with rest. encouraged use of IS and flutter, will follow acutely for safe return home once medically ready. Pt will benefit from skilled PT to increase their independence and safety with mobility to allow discharge to the venue listed below.       Follow Up Recommendations No PT follow up    Equipment Recommendations  None recommended by PT    Recommendations for Other Services       Precautions / Restrictions Precautions Precautions: None Precaution Comments: Watch O2      Mobility  Bed Mobility Overal bed mobility: Independent                Transfers Overall transfer level: Independent                  Ambulation/Gait Ambulation/Gait assistance: Supervision Gait Distance (Feet): 150 Feet Assistive device: None Gait Pattern/deviations: WFL(Within Functional Limits) Gait velocity: normal   General Gait Details: ambulating normal pace without AD or LOB, no DOE, SpO2 88% on RA  Stairs            Wheelchair Mobility    Modified Rankin (Stroke Patients Only)       Balance Overall balance assessment: No apparent balance deficits (not formally assessed)                                           Pertinent Vitals/Pain Pain Assessment: No/denies pain    Home Living Family/patient expects to be discharged to:: Private residence Living Arrangements:  Children Available Help at Discharge: Family;Available 24 hours/day Type of Home: House Home Access: Level entry     Home Layout: One level Home Equipment: None      Prior Function Level of Independence: Independent         Comments: drives, independent with mobility ADLs, denies falls, no home O2     Hand Dominance        Extremity/Trunk Assessment   Upper Extremity Assessment Upper Extremity Assessment: Defer to OT evaluation    Lower Extremity Assessment Lower Extremity Assessment: Overall WFL for tasks assessed       Communication   Communication: No difficulties  Cognition Arousal/Alertness: Awake/alert Behavior During Therapy: WFL for tasks assessed/performed Overall Cognitive Status: Within Functional Limits for tasks assessed                                        General Comments General comments (skin integrity, edema, etc.): Discussed use of flutter and IS     Exercises     Assessment/Plan    PT Assessment Patient needs continued PT services  PT Problem List Cardiopulmonary status limiting activity       PT Treatment Interventions DME instruction;Gait training;Stair training;Functional mobility training;Therapeutic activities;Therapeutic exercise  PT Goals (Current goals can be found in the Care Plan section)  Acute Rehab PT Goals Patient Stated Goal: go home when ready PT Goal Formulation: With patient Potential to Achieve Goals: Good    Frequency Min 3X/week   Barriers to discharge        Co-evaluation               AM-PAC PT "6 Clicks" Mobility  Outcome Measure Help needed turning from your back to your side while in a flat bed without using bedrails?: None Help needed moving from lying on your back to sitting on the side of a flat bed without using bedrails?: None Help needed moving to and from a bed to a chair (including a wheelchair)?: None Help needed standing up from a chair using your arms (e.g.,  wheelchair or bedside chair)?: None Help needed to walk in hospital room?: A Little Help needed climbing 3-5 steps with a railing? : A Little 6 Click Score: 22    End of Session Equipment Utilized During Treatment: Gait belt Activity Tolerance: Patient tolerated treatment well Patient left: in bed Nurse Communication: Mobility status PT Visit Diagnosis: Difficulty in walking, not elsewhere classified (R26.2)    Time: 4734-0370 PT Time Calculation (min) (ACUTE ONLY): 24 min   Charges:   PT Evaluation $PT Eval Low Complexity: 1 Low PT Treatments $Gait Training: 8-22 mins        Reinaldo Berber, PT, DPT Acute Rehabilitation Services Pager: [phone] Office: [place] 03/26/2018, 12:14 PM

## 2018-03-26 NOTE — Progress Notes (Signed)
Nutrition Consult/Brief Note  RD consulted via COPD Exacerbation protocol.  Wt Readings from Last 15 Encounters:  03/25/18 69.6 kg    Body mass index is 24.77 kg/m. Patient meets criteria for Normal based on current BMI.   Current diet order is Heart Healthy, patient is consuming approximately 75-100% of meals at this time. Labs and medications reviewed.   No nutrition interventions warranted at this time. If nutrition issues arise, please consult RD.   Arthur Holms, RD, LDN Pager #: [phone] After-Hours Pager #: [phone]

## 2018-03-26 NOTE — Progress Notes (Signed)
OT Cancellation Note  Patient Details Name: Heidi Ramirez MRN: [id number] DOB: 1950-06-23   Cancelled Treatment:    Reason Eval/Treat Not Completed: OT screened, no needs identified, will sign off.  Per RN, pt is performing ADLs mod I.    Lucille Passy, OTR/L Acute Rehabilitation Services Pager [phone] Office [phone]   Lucille Passy M 03/26/2018, 12:19 PM

## 2018-03-26 NOTE — Progress Notes (Signed)
Pt ambulated in hall. O2 saturation >90% for majority of walk. Pt did bump down to 88% upon completion of walk when we arrived to room. Recovered to >90% with 1L nasal cannula within 2 minutes.   Fritz Pickerel, RN

## 2018-03-27 ENCOUNTER — Observation Stay (HOSPITAL_COMMUNITY): Payer: Medicare Other

## 2018-03-27 DIAGNOSIS — J209 Acute bronchitis, unspecified: Secondary | ICD-10-CM | POA: Diagnosis not present

## 2018-03-27 DIAGNOSIS — I10 Essential (primary) hypertension: Secondary | ICD-10-CM | POA: Diagnosis not present

## 2018-03-27 DIAGNOSIS — J440 Chronic obstructive pulmonary disease with acute lower respiratory infection: Secondary | ICD-10-CM | POA: Diagnosis not present

## 2018-03-27 DIAGNOSIS — J441 Chronic obstructive pulmonary disease with (acute) exacerbation: Secondary | ICD-10-CM | POA: Diagnosis not present

## 2018-03-27 DIAGNOSIS — R0602 Shortness of breath: Secondary | ICD-10-CM | POA: Diagnosis not present

## 2018-03-27 DIAGNOSIS — F17200 Nicotine dependence, unspecified, uncomplicated: Secondary | ICD-10-CM | POA: Diagnosis not present

## 2018-03-27 NOTE — Progress Notes (Signed)
Patient Saturations on Room Air at Rest = 87%  Patient Saturations on [employer] while Ambulating = 85%  Patient Saturations on 1L Liters of oxygen while Ambulating = 86%  Patient Saturations on 2L Liters of oxygen while Ambulating = 88%  Patient Saturations on 3L Liters of oxygen while Ambulating = 90%

## 2018-03-27 NOTE — Progress Notes (Signed)
PROGRESS NOTE  Heidi Ramirez KXF:[id number] DOB: 1951-01-09 DOA: 03/25/2018 PCP: Patient, No Pcp Per   LOS: 0 days   Brief Narrative / Interim history: 67 year old female with longstanding tobacco use, hypertension, melanoma with prior resection, high suspicion of COPD presents to the hospital with cough, shortness of breath persistent for the past couple weeks but suddenly getting worse in the last 1 to 2 days.  She is also been complaining of a productive cough.  Subjective: -feeling OK this morning but worse on ambulation, hypoxia worse than yesterday. Felt lightheaded as well    Assessment & Plan: Principal Problem:   COPD with acute bronchitis ([place]) Active Problems:   Hypoxia   HTN (hypertension)   Smoking   Principal Problem Acute hypoxic respiratory failure in the setting of COPD exacerbation -Patient started on steroids, nebulizers, empiric doxycycline, continue -Wean off to room air if tolerated, suspect may need home O2 -Not formally diagnosed with COPD but highly suspicious based on chest x-ray and physical exam as well as longstanding tobacco use -slightly worsening hypoxia today, felt worse than yesterday. Will obtain chest CT  Additional Problems Hypertension -Continue Norvasc, BP stable   Tobacco use -Strongly counseled for cessation, she is determined to quit   Scheduled Meds: . amLODipine  2.5 mg Oral Daily  . budesonide (PULMICORT) nebulizer solution  0.25 mg Nebulization BID  . doxycycline  100 mg Oral Q12H  . enoxaparin (LOVENOX) injection  40 mg Subcutaneous Q24H  . ipratropium  0.5 mg Nebulization TID  . predniSONE  40 mg Oral Q breakfast   Continuous Infusions: PRN Meds:.albuterol, guaiFENesin-dextromethorphan  DVT prophylaxis: Lovenox Code Status: Full code Family Communication: Son present at bedside Disposition Plan: Home when improved  Consultants:   None  Procedures:   None   Antimicrobials:  Doxycycline 12/26 >>    Objective: Vitals:   03/27/18 0824 03/27/18 0828 03/27/18 1410 03/27/18 1625  BP:    (!) 151/71  Pulse: 73  83 78  Resp: 16  18 15   Temp:    98.1 F (36.7 C)  TempSrc:    Oral  SpO2: 93% 94% 93% 94%  Weight:      Height:        Intake/Output Summary (Last 24 hours) at 03/27/2018 1733 Last data filed at 03/27/2018 0700 Gross per 24 hour  Intake 240 ml  Output -  Net 240 ml   Filed Weights   03/25/18 1439 03/25/18 2158  Weight: 68.9 kg 69.6 kg    Examination:  Constitutional: NAD Eyes: no scleral icterus  ENMT: mmm Respiratory: end expiratory wheezing, tachypneic Cardiovascular: RRR, no murmurs, no edema  Abdomen: soft, NT, ND, BS + Musculoskeletal: no clubbing  Skin: no rashes seen  Neurologic: non focal, equal strength  Psychiatric: Normal judgment and insight. Alert and oriented x 3. Normal mood.    Data Reviewed: I have independently reviewed following labs and imaging studies   CBC: Recent Labs  Lab 03/25/18 1527  WBC 11.0*  NEUTROABS 9.2*  HGB 15.1*  HCT 47.8*  MCV 94.7  PLT 696*   Basic Metabolic Panel: Recent Labs  Lab 03/25/18 1527  NA 139  K 4.4  CL 104  CO2 24  GLUCOSE 108*  BUN 10  CREATININE 0.83  CALCIUM 9.4   GFR: Estimated Creatinine Clearance: 62.4 mL/min (by C-G formula based on SCr of 0.83 mg/dL). Liver Function Tests: No results for input(s): AST, ALT, ALKPHOS, BILITOT, PROT, ALBUMIN in the last 168 hours. No results for  input(s): LIPASE, AMYLASE in the last 168 hours. No results for input(s): AMMONIA in the last 168 hours. Coagulation Profile: No results for input(s): INR, PROTIME in the last 168 hours. Cardiac Enzymes: Recent Labs  Lab 03/25/18 1527  TROPONINI <0.03   BNP (last 3 results) No results for input(s): PROBNP in the last 8760 hours. HbA1C: No results for input(s): HGBA1C in the last 72 hours. CBG: No results for input(s): GLUCAP in the last 168 hours. Lipid Profile: No results for input(s): CHOL,  HDL, LDLCALC, TRIG, CHOLHDL, LDLDIRECT in the last 72 hours. Thyroid Function Tests: No results for input(s): TSH, T4TOTAL, FREET4, T3FREE, THYROIDAB in the last 72 hours. Anemia Panel: No results for input(s): VITAMINB12, FOLATE, FERRITIN, TIBC, IRON, RETICCTPCT in the last 72 hours. Urine analysis: No results found for: COLORURINE, APPEARANCEUR, LABSPEC, PHURINE, GLUCOSEU, HGBUR, BILIRUBINUR, KETONESUR, PROTEINUR, UROBILINOGEN, NITRITE, LEUKOCYTESUR Sepsis Labs: Invalid input(s): PROCALCITONIN, LACTICIDVEN  No results found for this or any previous visit (from the past 240 hour(s)).    Radiology Studies: Dg Chest 2 View  Result Date: 03/25/2018 CLINICAL DATA:  Cough and shortness of breath EXAM: CHEST - 2 VIEW COMPARISON:  March 25, 2018 study obtained earlier in the day FINDINGS: There is no edema or consolidation. Heart size and pulmonary vascularity are normal. No adenopathy. There is aortic atherosclerosis. No evident bone lesions. IMPRESSION: Aortic atherosclerosis.  No edema or consolidation. Aortic Atherosclerosis (ICD10-I70.0). Electronically Signed   By: Lowella Grip III M.D.   On: 03/25/2018 19:18    Marzetta Board, MD, PhD [facility] Pager [phone]  If 7PM-7AM, please contact night-coverage [email or web address] Password [facility] 03/27/2018, 5:33 PM

## 2018-03-28 DIAGNOSIS — R0902 Hypoxemia: Secondary | ICD-10-CM | POA: Diagnosis not present

## 2018-03-28 DIAGNOSIS — J209 Acute bronchitis, unspecified: Secondary | ICD-10-CM | POA: Diagnosis not present

## 2018-03-28 DIAGNOSIS — J440 Chronic obstructive pulmonary disease with acute lower respiratory infection: Secondary | ICD-10-CM | POA: Diagnosis not present

## 2018-03-28 DIAGNOSIS — I10 Essential (primary) hypertension: Secondary | ICD-10-CM | POA: Diagnosis not present

## 2018-03-28 DIAGNOSIS — F17200 Nicotine dependence, unspecified, uncomplicated: Secondary | ICD-10-CM | POA: Diagnosis not present

## 2018-03-28 MED ORDER — ALBUTEROL SULFATE HFA 108 (90 BASE) MCG/ACT IN AERS: 2.0000 | INHALATION_SPRAY | Freq: Four times a day (QID) | RESPIRATORY_TRACT | 0 refills | Status: DC | PRN

## 2018-03-28 MED ORDER — TIOTROPIUM BROMIDE MONOHYDRATE 18 MCG IN CAPS: 18.0000 ug | ORAL_CAPSULE | Freq: Every day | RESPIRATORY_TRACT | 1 refills | Status: DC

## 2018-03-28 MED ORDER — DOXYCYCLINE HYCLATE 100 MG PO TABS: 100.0000 mg | ORAL_TABLET | Freq: Two times a day (BID) | ORAL | 0 refills | Status: DC

## 2018-03-28 MED ORDER — SIMETHICONE 80 MG PO CHEW: 80.0000 mg | CHEWABLE_TABLET | Freq: Once | ORAL | Status: DC

## 2018-03-28 MED ORDER — GUAIFENESIN-DM 100-10 MG/5ML PO SYRP: 5.0000 mL | ORAL_SOLUTION | ORAL | 0 refills | Status: DC | PRN

## 2018-03-28 MED ORDER — PREDNISONE 20 MG PO TABS: 20.0000 mg | ORAL_TABLET | Freq: Every day | ORAL | 0 refills | Status: DC

## 2018-03-28 NOTE — Progress Notes (Signed)
03/28/2018 Patient saturation was 92% on room air at 0845. [facility] RN.

## 2018-03-28 NOTE — Discharge Summary (Signed)
Physician Discharge Summary  Heidi Ramirez ZDG:[id number] DOB: 05-15-1950 DOA: 03/25/2018  PCP: Patient, No Pcp Per  Admit date: 03/25/2018 Discharge date: 03/28/2018  Admitted From: Home Disposition: Home  Recommendations for Outpatient Follow-up:  1. Follow up with PCP in 1-2 weeks 2. Will need PFTs as an outpatient  Home Health: None Equipment/Devices: None  Discharge Condition: Stable CODE STATUS: Full code Diet recommendation: Regular  HPI: Per Dr. Alcario Drought, Heidi Ramirez is a 67 y.o. female with medical history significant of HTN, melanoma s/p resection without known recurrence, smoking.  Never formally diagnosed with COPD previously though suspected on prior CXRs as far back as 2006 due to increased lung volumes it looks like. Patient presents the emergency department today for evaluation of a cough and shortness of breath that have been present for the last 2 weeks but seem to be worsening over the last 1 to 2 days. Patient states that she began to have a productive cough about 2 weeks ago. She was seen at a walk-in clinic and given an inhaler and fluticasone to treat bronchitis. CXR neg for PNA then. States that over the past 2 days she has been using her albuterol inhaler every hour without significant relief of her shortness of breath. Shortness of breath is worse with ambulation and when laying flat. She denies any significant chest pain. Does have: Nasal congestion, wheezing. No: sore throat, fever, leg swelling, h/o DVT/PE.   Hospital Course: Principal Problem Acute hypoxic respiratory failure in the setting of COPD exacerbation -patient was admitted to the hospital with acute hypoxic respiratory failure due to COPD exacerbation.  She has not been formally diagnosed with COPD however there is high clinical suspicion given longstanding tobacco use as well as characteristic chest x-ray imaging.  She was started on steroids, nebulizers, empiric doxycycline with  significant improvement.  On the day of discharge, she is able to ambulate on room air, without significant dyspnea, her wheezing has completely resolved, she is afebrile and will be discharged home in stable condition.  She will follow-up with PCP in 1 to 2 weeks and will likely need PFTs as an outpatient.  Additional Problems Hypertension -Continue home medications Tobacco use -she is determined to quit  Discharge Diagnoses:  Principal Problem:   COPD with acute bronchitis ([place]) Active Problems:   Hypoxia   HTN (hypertension)   Smoking     Discharge Instructions   Allergies as of 03/28/2018   No Known Allergies     Medication List    TAKE these medications   albuterol 108 (90 Base) MCG/ACT inhaler Commonly known as:  PROVENTIL HFA;VENTOLIN HFA Inhale 2 puffs into the lungs every 6 (six) hours as needed for wheezing or shortness of breath.   amLODipine 2.5 MG tablet Commonly known as:  NORVASC Take 2.5 mg by mouth daily.   doxycycline 100 MG tablet Commonly known as:  VIBRA-TABS Take 1 tablet (100 mg total) by mouth every 12 (twelve) hours.   fluticasone 50 MCG/ACT nasal spray Commonly known as:  FLONASE Place 2 sprays into both nostrils daily.   guaiFENesin-dextromethorphan 100-10 MG/5ML syrup Commonly known as:  ROBITUSSIN DM Take 5 mLs by mouth every 4 (four) hours as needed for cough.   predniSONE 20 MG tablet Commonly known as:  DELTASONE Take 1 tablet (20 mg total) by mouth daily with breakfast.   tiotropium 18 MCG inhalation capsule Commonly known as:  SPIRIVA HANDIHALER Place 1 capsule (18 mcg total) into inhaler and inhale daily.  Follow-up Information    Primary MD. Schedule an appointment as soon as possible for a visit in 2 week(s).           Consultations:  None   Procedures/Studies:  Dg Chest 2 View  Result Date: 03/25/2018 CLINICAL DATA:  Cough and shortness of breath EXAM: CHEST - 2 VIEW COMPARISON:  March 25, 2018 study  obtained earlier in the day FINDINGS: There is no edema or consolidation. Heart size and pulmonary vascularity are normal. No adenopathy. There is aortic atherosclerosis. No evident bone lesions. IMPRESSION: Aortic atherosclerosis.  No edema or consolidation. Aortic Atherosclerosis (ICD10-I70.0). Electronically Signed   By: Lowella Grip III M.D.   On: 03/25/2018 19:18   Ct Chest Wo Contrast  Result Date: 03/27/2018 CLINICAL DATA:  COPD exacerbation. Dyspnea. Shortness of breath. EXAM: CT CHEST WITHOUT CONTRAST TECHNIQUE: Multidetector CT imaging of the chest was performed following the standard protocol without IV contrast. COMPARISON:  Chest x-ray dated 03/25/2018 FINDINGS: Cardiovascular: Aortic atherosclerosis. Slight coronary artery calcification. Heart size is normal. No pericardial effusion. Mediastinum/Nodes: No enlarged mediastinal or axillary lymph nodes. Thyroid gland, trachea, and esophagus demonstrate no significant findings. Lungs/Pleura: There are no infiltrates or effusions. There is slight peribronchial thickening which could be acute or chronic. Upper Abdomen: No acute abnormality. Musculoskeletal: No chest wall mass or suspicious bone lesions identified. IMPRESSION: Slight peribronchial thickening which could be acute or chronic. Aortic Atherosclerosis (ICD10-I70.0). Electronically Signed   By: Lorriane Shire M.D.   On: 03/27/2018 19:14      Subjective: - no chest pain, shortness of breath, no abdominal pain, nausea or vomiting.   Discharge Exam: Vitals:   03/28/18 0814 03/28/18 0835  BP: (!) 163/67   Pulse: 72   Resp:    Temp:    SpO2: 95% 97%    General: Pt is alert, awake, not in acute distress Cardiovascular: RRR, S1/S2 +, no rubs, no gallops Respiratory: CTA bilaterally, no wheezing, no rhonchi Abdominal: Soft, NT, ND, bowel sounds + Extremities: no edema, no cyanosis    The results of significant diagnostics from this hospitalization (including imaging,  microbiology, ancillary and laboratory) are listed below for reference.     Microbiology: No results found for this or any previous visit (from the past 240 hour(s)).   Labs: BNP (last 3 results) Recent Labs    03/25/18 1527  BNP 23.5   Basic Metabolic Panel: Recent Labs  Lab 03/25/18 1527  NA 139  K 4.4  CL 104  CO2 24  GLUCOSE 108*  BUN 10  CREATININE 0.83  CALCIUM 9.4   Liver Function Tests: No results for input(s): AST, ALT, ALKPHOS, BILITOT, PROT, ALBUMIN in the last 168 hours. No results for input(s): LIPASE, AMYLASE in the last 168 hours. No results for input(s): AMMONIA in the last 168 hours. CBC: Recent Labs  Lab 03/25/18 1527  WBC 11.0*  NEUTROABS 9.2*  HGB 15.1*  HCT 47.8*  MCV 94.7  PLT 436*   Cardiac Enzymes: Recent Labs  Lab 03/25/18 1527  TROPONINI <0.03   BNP: Invalid input(s): POCBNP CBG: No results for input(s): GLUCAP in the last 168 hours. D-Dimer No results for input(s): DDIMER in the last 72 hours. Hgb A1c No results for input(s): HGBA1C in the last 72 hours. Lipid Profile No results for input(s): CHOL, HDL, LDLCALC, TRIG, CHOLHDL, LDLDIRECT in the last 72 hours. Thyroid function studies No results for input(s): TSH, T4TOTAL, T3FREE, THYROIDAB in the last 72 hours.  Invalid input(s): FREET3 Anemia work  up No results for input(s): VITAMINB12, FOLATE, FERRITIN, TIBC, IRON, RETICCTPCT in the last 72 hours. Urinalysis No results found for: COLORURINE, APPEARANCEUR, LABSPEC, [employer], GLUCOSEU, HGBUR, BILIRUBINUR, KETONESUR, PROTEINUR, UROBILINOGEN, NITRITE, LEUKOCYTESUR Sepsis Labs Invalid input(s): PROCALCITONIN,  WBC,  LACTICIDVEN   Time coordinating discharge: 35 minutes  SIGNED:  Marzetta Board, MD  [facility] 03/28/2018, 1:44 PM Pager [phone]  If 7PM-7AM, please contact night-coverage [email or web address] Password [email or web address]

## 2018-03-28 NOTE — Discharge Instructions (Signed)
Follow with primary MD in 1-2 weeks  Please get a complete blood count and chemistry panel checked by your Primary MD at your next visit, and again as instructed by your Primary MD. Please get your medications reviewed and adjusted by your Primary MD.  Please request your Primary MD to go over all Hospital Tests and Procedure/Radiological results at the follow up, please get all Hospital records sent to your Prim MD by signing hospital release before you go home.  If you had Pneumonia of Lung problems at the Hospital: Please get a 2 view Chest X ray done in 6-8 weeks after hospital discharge or sooner if instructed by your Primary MD.  If you have Congestive Heart Failure: Please call your Cardiologist or Primary MD anytime you have any of the following symptoms:  1) 3 pound weight gain in 24 hours or 5 pounds in 1 week  2) shortness of breath, with or without a dry hacking cough  3) swelling in the hands, feet or stomach  4) if you have to sleep on extra pillows at night in order to breathe  Follow cardiac low salt diet and 1.5 lit/day fluid restriction.  If you have diabetes Accuchecks 4 times/day, Once in AM empty stomach and then before each meal. Log in all results and show them to your primary doctor at your next visit. If any glucose reading is under 80 or above 300 call your primary MD immediately.  If you have Seizure/Convulsions/Epilepsy: Please do not drive, operate heavy machinery, participate in activities at heights or participate in high speed sports until you have seen by Primary MD or a Neurologist and advised to do so again.  If you had Gastrointestinal Bleeding: Please ask your Primary MD to check a complete blood count within one week of discharge or at your next visit. Your endoscopic/colonoscopic biopsies that are pending at the time of discharge, will also need to followed by your Primary MD.  Get Medicines reviewed and adjusted. Please take all your medications  with you for your next visit with your Primary MD  Please request your Primary MD to go over all hospital tests and procedure/radiological results at the follow up, please ask your Primary MD to get all Hospital records sent to his/her office.  If you experience worsening of your admission symptoms, develop shortness of breath, life threatening emergency, suicidal or homicidal thoughts you must seek medical attention immediately by calling 911 or calling your MD immediately  if symptoms less severe.  You must read complete instructions/literature along with all the possible adverse reactions/side effects for all the Medicines you take and that have been prescribed to you. Take any new Medicines after you have completely understood and accpet all the possible adverse reactions/side effects.   Do not drive or operate heavy machinery when taking Pain medications.   Do not take more than prescribed Pain, Sleep and Anxiety Medications  Special Instructions: If you have smoked or chewed Tobacco  in the last 2 yrs please stop smoking, stop any regular Alcohol  and or any Recreational drug use.  Wear Seat belts while driving.  Please note You were cared for by a hospitalist during your hospital stay. If you have any questions about your discharge medications or the care you received while you were in the hospital after you are discharged, you can call the unit and asked to speak with the hospitalist on call if the hospitalist that took care of you is not available. Once you  are discharged, your primary care physician will handle any further medical issues. Please note that NO REFILLS for any discharge medications will be authorized once you are discharged, as it is imperative that you return to your primary care physician (or establish a relationship with a primary care physician if you do not have one) for your aftercare needs so that they can reassess your need for medications and monitor your lab  values.  You can reach the hospitalist office at phone [phone] or fax [phone]   If you do not have a primary care physician, you can call [phone] for a physician referral.  Activity: As tolerated with Full fall precautions use walker/cane & assistance as needed  Diet: regular  Disposition Home

## 2018-04-05 DIAGNOSIS — L570 Actinic keratosis: Secondary | ICD-10-CM | POA: Diagnosis not present

## 2018-04-08 DIAGNOSIS — Z1211 Encounter for screening for malignant neoplasm of colon: Secondary | ICD-10-CM | POA: Diagnosis not present

## 2018-04-08 DIAGNOSIS — J439 Emphysema, unspecified: Secondary | ICD-10-CM | POA: Diagnosis not present

## 2018-04-08 DIAGNOSIS — J441 Chronic obstructive pulmonary disease with (acute) exacerbation: Secondary | ICD-10-CM | POA: Diagnosis not present

## 2018-04-16 DIAGNOSIS — H43812 Vitreous degeneration, left eye: Secondary | ICD-10-CM | POA: Diagnosis not present

## 2018-04-16 DIAGNOSIS — H2512 Age-related nuclear cataract, left eye: Secondary | ICD-10-CM | POA: Diagnosis not present

## 2018-04-16 DIAGNOSIS — H35341 Macular cyst, hole, or pseudohole, right eye: Secondary | ICD-10-CM | POA: Diagnosis not present

## 2018-04-16 DIAGNOSIS — H26491 Other secondary cataract, right eye: Secondary | ICD-10-CM | POA: Diagnosis not present

## 2018-04-22 ENCOUNTER — Encounter: Payer: Self-pay | Admitting: Pulmonary Disease

## 2018-04-22 ENCOUNTER — Ambulatory Visit (INDEPENDENT_AMBULATORY_CARE_PROVIDER_SITE_OTHER): Payer: Medicare Other | Admitting: Pulmonary Disease

## 2018-04-22 VITALS — BP 150/68 | HR 83 | Ht 66.25 in | Wt 153.6 lb

## 2018-04-22 DIAGNOSIS — J440 Chronic obstructive pulmonary disease with acute lower respiratory infection: Secondary | ICD-10-CM

## 2018-04-22 DIAGNOSIS — F17200 Nicotine dependence, unspecified, uncomplicated: Secondary | ICD-10-CM

## 2018-04-22 DIAGNOSIS — J441 Chronic obstructive pulmonary disease with (acute) exacerbation: Secondary | ICD-10-CM | POA: Diagnosis not present

## 2018-04-22 DIAGNOSIS — J209 Acute bronchitis, unspecified: Secondary | ICD-10-CM

## 2018-04-22 NOTE — Assessment & Plan Note (Signed)
Lung function is decreased at 60% but no definite evidence of airway obstruction Stop taking Breo -this may be causing hoarseness of voice If necessary, call us back and we can start you on new medication. Use albuterol MDI 2 puffs as needed

## 2018-04-22 NOTE — Assessment & Plan Note (Signed)
Congratulations on quitting smoking! She is at high risk for relapse and we discussed strategies to avoid relapse and weight gain

## 2018-04-22 NOTE — Patient Instructions (Signed)
Lung function is decreased at 60%. Stop taking Breo -this may be causing hoarseness of voice If necessary, call us back and we can start you on new medication. Use albuterol MDI 2 puffs as needed  Congratulations on quitting smoking!

## 2018-04-22 NOTE — Progress Notes (Signed)
Subjective:    Patient ID: Heidi Ramirez, female    DOB: Mar 17, 1951, 68 y.o.   MRN: [id number]  HPI  Chief Complaint  Patient presents with  . Pulm Consult    Referred by Dr. Stephanie Acre for SOB. Stated that this started in between Christmas and New Years.    68 year old smoker presents for evaluation of COPD after recent hospitalization. She smoked about 1 pack/day starting at age 69 and has been cut down in more recent years to about half pack per day until she quit after this hospitalization. She states that she was sick all of December with cold and sinus symptoms and in fact was evaluated in walk-in clinic couple of times.  She was finally hospitalized 12/26 and treated for COPD exacerbation with steroids, nebulizers and doxycycline.  Chest x-ray was clear. CT chest 12/28 did not show any infiltrates or effusions or significant emphysema. She initially required oxygen on admission but it was discharged was found to have good oxygen levels room air. Prior to this, she was only using her albuterol on an as-needed basis.  After this hospitalization, she was initially placed on Spiriva but had a paradoxical increase in MDI usage hence she was changed to [facility].  She now states that her breathing is now back to baseline but reports hoarseness of voice for 2 weeks. She reports bronchitis symptoms about once a year for the last 3 years   Spirometry showed ratio of 79, FEV1 of 60% FVC of 59%, surprisingly not much of obstruction    Past Medical History:  Diagnosis Date  . Heart murmur    since birth  . HTN (hypertension)   . Melanoma of trunk [facility])    Past Surgical History:  Procedure Laterality Date  . EYE SURGERY    . MELANOMA EXCISION      No Known Allergies  Social History   Socioeconomic History  . Marital status: Widowed    Spouse name: Not on file  . Number of children: Not on file  . Years of education: Not on file  . Highest education level: Not on file    Occupational History  . Not on file  Social Needs  . Financial resource strain: Not on file  . Food insecurity:    Worry: Not on file    Inability: Not on file  . Transportation needs:    Medical: Not on file    Non-medical: Not on file  Tobacco Use  . Smoking status: Former Smoker    Packs/day: 0.50    Types: Cigarettes    Last attempt to quit: 04/01/2018    Years since quitting: 0.0  . Smokeless tobacco: Never Used  Substance and Sexual Activity  . Alcohol use: Never    Frequency: Never  . Drug use: Never  . Sexual activity: Not on file  Lifestyle  . Physical activity:    Days per week: Not on file    Minutes per session: Not on file  . Stress: Not on file  Relationships  . Social connections:    Talks on phone: Not on file    Gets together: Not on file    Attends religious service: Not on file    Active member of club or organization: Not on file    Attends meetings of clubs or organizations: Not on file    Relationship status: Not on file  . Intimate partner violence:    Fear of current or ex partner: Not on file  Emotionally abused: Not on file    Physically abused: Not on file    Forced sexual activity: Not on file  Other Topics Concern  . Not on file  Social History Narrative  . Not on file      Family History  Problem Relation Age of Onset  . COPD Neg Hx   . Lung cancer Neg Hx      Review of Systems  Constitutional: Negative for fever and unexpected weight change.  HENT: Positive for congestion, rhinorrhea and sneezing. Negative for dental problem, ear pain, nosebleeds, postnasal drip, sinus pressure, sore throat and trouble swallowing.   Eyes: Negative for redness and itching.  Respiratory: Positive for cough and shortness of breath. Negative for chest tightness and wheezing.   Cardiovascular: Negative for palpitations and leg swelling.  Gastrointestinal: Negative for nausea and vomiting.  Genitourinary: Negative for dysuria.  Musculoskeletal:  Negative for joint swelling.  Skin: Negative for rash.  Allergic/Immunologic: Negative.  Negative for environmental allergies, food allergies and immunocompromised state.  Neurological: Negative for headaches.  Hematological: Does not bruise/bleed easily.  Psychiatric/Behavioral: Negative for dysphoric mood. The patient is not nervous/anxious.        Objective:   Physical Exam  Gen. Pleasant, well-nourished, in no distress, normal affect ENT - no pallor,icterus, no post nasal drip, overbite Neck: No JVD, no thyromegaly, no carotid bruits Lungs: no use of accessory muscles, no dullness to percussion, decreased without rales or rhonchi  Cardiovascular: Rhythm regular, heart sounds  normal, no murmurs or gallops, no peripheral edema Abdomen: soft and non-tender, no hepatosplenomegaly, BS normal. Musculoskeletal: No deformities, no cyanosis or clubbing Neuro:  alert, non focal       Assessment & Plan:

## 2018-04-29 DIAGNOSIS — Z1211 Encounter for screening for malignant neoplasm of colon: Secondary | ICD-10-CM | POA: Diagnosis not present

## 2018-04-29 DIAGNOSIS — Z0000 Encounter for general adult medical examination without abnormal findings: Secondary | ICD-10-CM | POA: Diagnosis not present

## 2018-04-29 DIAGNOSIS — Z79899 Other long term (current) drug therapy: Secondary | ICD-10-CM | POA: Diagnosis not present

## 2018-04-29 DIAGNOSIS — I10 Essential (primary) hypertension: Secondary | ICD-10-CM | POA: Diagnosis not present

## 2018-04-29 DIAGNOSIS — E559 Vitamin D deficiency, unspecified: Secondary | ICD-10-CM | POA: Diagnosis not present

## 2018-04-29 DIAGNOSIS — J449 Chronic obstructive pulmonary disease, unspecified: Secondary | ICD-10-CM | POA: Diagnosis not present

## 2018-04-29 DIAGNOSIS — I491 Atrial premature depolarization: Secondary | ICD-10-CM | POA: Diagnosis not present

## 2018-04-29 DIAGNOSIS — C439 Malignant melanoma of skin, unspecified: Secondary | ICD-10-CM | POA: Diagnosis not present

## 2018-04-29 DIAGNOSIS — E7800 Pure hypercholesterolemia, unspecified: Secondary | ICD-10-CM | POA: Diagnosis not present

## 2018-05-03 DIAGNOSIS — Z1211 Encounter for screening for malignant neoplasm of colon: Secondary | ICD-10-CM | POA: Diagnosis not present

## 2018-05-06 DIAGNOSIS — Z1231 Encounter for screening mammogram for malignant neoplasm of breast: Secondary | ICD-10-CM | POA: Diagnosis not present

## 2018-05-06 DIAGNOSIS — Z780 Asymptomatic menopausal state: Secondary | ICD-10-CM | POA: Diagnosis not present

## 2018-05-06 DIAGNOSIS — M8589 Other specified disorders of bone density and structure, multiple sites: Secondary | ICD-10-CM | POA: Diagnosis not present

## 2018-05-12 ENCOUNTER — Encounter: Payer: Self-pay | Admitting: Primary Care

## 2018-05-12 ENCOUNTER — Ambulatory Visit (INDEPENDENT_AMBULATORY_CARE_PROVIDER_SITE_OTHER): Payer: Medicare Other | Admitting: Primary Care

## 2018-05-12 DIAGNOSIS — J440 Chronic obstructive pulmonary disease with acute lower respiratory infection: Secondary | ICD-10-CM

## 2018-05-12 DIAGNOSIS — J209 Acute bronchitis, unspecified: Secondary | ICD-10-CM

## 2018-05-12 MED ORDER — BUDESONIDE-FORMOTEROL FUMARATE 80-4.5 MCG/ACT IN AERO: 2.0000 | INHALATION_SPRAY | Freq: Two times a day (BID) | RESPIRATORY_TRACT | 0 refills | Status: DC

## 2018-05-12 MED ORDER — BUDESONIDE-FORMOTEROL FUMARATE 80-4.5 MCG/ACT IN AERO: 2.0000 | INHALATION_SPRAY | Freq: Two times a day (BID) | RESPIRATORY_TRACT | 12 refills | Status: DC

## 2018-05-12 MED ORDER — AEROCHAMBER MV MISC: 0 refills | Status: AC

## 2018-05-12 MED ORDER — PREDNISONE 10 MG PO TABS: ORAL_TABLET | ORAL | 0 refills | Status: DC

## 2018-05-12 NOTE — Progress Notes (Signed)
@Patient  ID: Heidi Ramirez, female    DOB: 1950-07-18, 68 y.o.   MRN: [id number]  Chief Complaint  Patient presents with  . Acute Visit    voice hoarseness, wheezing, dry cough at times prod with white mucus and sob with exertion x6d    Referring provider: Jonathon Jordan, MD  HPI: 68 year female, former smoker (quit December 2019). PMH significant for COPD. Patient of Dr. Elsworth Soho, seen for initial consult on 04/22/18. Stopped Breo Jan 23rd d/t voice hoarseness. No reported benefit with Sp[employer]ia.   05/12/2018 Patient presents today for acute visit for shortness of breath and wheezing x 6 days. Associated dry cough. No purulent sputum. Continues to have voice hoarseness. Afebrile.   No Known Allergies  Immunization History  Administered Date(s) Administered  . Influenza, High Dose Seasonal PF 03/26/2018    Past Medical History:  Diagnosis Date  . Heart murmur    since birth  . HTN (hypertension)   . Melanoma of trunk ([place])     Tobacco History: Social History   Tobacco Use  Smoking Status Former Smoker  . Packs/day: 0.50  . Types: Cigarettes  . Last attempt to quit: 04/01/2018  . Years since quitting: 0.1  Smokeless Tobacco Never Used   Counseling given: Not Answered   Outpatient Medications Prior to Visit  Medication Sig Dispense Refill  . albuterol (PROVENTIL HFA;VENTOLIN HFA) 108 (90 Base) MCG/ACT inhaler Inhale 2 puffs into the lungs every 6 (six) hours as needed for wheezing or shortness of breath. 1 Inhaler 0  . amLODipine (NORVASC) 2.5 MG tablet Take 2.5 mg by mouth daily.    . fluticasone (FLONASE) 50 MCG/ACT nasal spray Place 2 sprays into both nostrils daily.    . Tiotropium Bromide Monohydrate (SPIRIVA RESPIMAT) 2.5 MCG/ACT AERS Inhale into the lungs.    . fluticasone furoate-vilanterol (BREO ELLIPTA) 200-25 MCG/INH AEPB Inhale 1 puff into the lungs daily.     No facility-administered medications prior to visit.     Review of Systems  Review of Systems    Constitutional: Negative.   Respiratory: Positive for cough, shortness of breath and wheezing.   Cardiovascular: Negative.     Physical Exam  BP 130/70 (BP Location: Right Arm, Cuff Size: Normal)   Pulse 91   Ht 5' 6.25" (1.683 m)   Wt 155 lb 12.8 oz (70.7 kg)   SpO2 97%   BMI 24.96 kg/m  Physical Exam Constitutional:      Appearance: Normal appearance. She is not ill-appearing.  HENT:     Right Ear: Tympanic membrane normal.     Left Ear: Tympanic membrane normal.     Mouth/Throat:     Mouth: Mucous membranes are moist.     Pharynx: Oropharynx is clear.  Eyes:     Extraocular Movements: Extraocular movements intact.     Pupils: Pupils are equal, round, and reactive to light.  Neck:     Musculoskeletal: Normal range of motion and neck supple.  Cardiovascular:     Rate and Rhythm: Normal rate and regular rhythm.  Pulmonary:     Breath sounds: Wheezing present.     Comments: Wheezing t/o lung fields  Skin:    General: Skin is warm and dry.  Neurological:     General: No focal deficit present.     Mental Status: She is alert and oriented to person, place, and time. Mental status is at baseline.  Psychiatric:        Mood and Affect: Mood normal.  Behavior: Behavior normal.        Thought Content: Thought content normal.        Judgment: Judgment normal.      Lab Results:  CBC    Component Value Date/Time   WBC 11.0 (H) 03/25/2018 1527   RBC 5.05 03/25/2018 1527   HGB 15.1 (H) 03/25/2018 1527   HCT 47.8 (H) 03/25/2018 1527   PLT 436 (H) 03/25/2018 1527   MCV 94.7 03/25/2018 1527   MCH 29.9 03/25/2018 1527   MCHC 31.6 03/25/2018 1527   RDW 13.0 03/25/2018 1527   LYMPHSABS 0.8 03/25/2018 1527   MONOABS 0.5 03/25/2018 1527   EOSABS 0.3 03/25/2018 1527   BASOSABS 0.1 03/25/2018 1527    BMET    Component Value Date/Time   NA 139 03/25/2018 1527   K 4.4 03/25/2018 1527   CL 104 03/25/2018 1527   CO2 24 03/25/2018 1527   GLUCOSE 108 (H) 03/25/2018  1527   BUN 10 03/25/2018 1527   CREATININE 0.83 03/25/2018 1527   CALCIUM 9.4 03/25/2018 1527   GFRNONAA >60 03/25/2018 1527   GFRAA >60 03/25/2018 1527    BNP    Component Value Date/Time   BNP 84.5 03/25/2018 1527    ProBNP No results found for: PROBNP  Imaging: No results found.   Assessment & Plan:   COPD with acute bronchitis ([place]) - Seen today for acute visit with complaints of shortness of breath and increased wheezing x6 days - Audible wheezes t/o lung fields on exam  - No signs of acute infection, abx not indicated at this time - RX needs prednisone taper today and trial Symbicort 80 two puffs twice day (sample give and RX sent) - Instructed patient to use spacer with inhaler  - FU in April or sooner if symptoms do not improve or worsen   Martyn Ehrich, NP 05/12/2018

## 2018-05-12 NOTE — Assessment & Plan Note (Signed)
-   Seen today for acute visit with complaints of shortness of breath and increased wheezing x6 days - Audible wheezes t/o lung fields on exam  - No signs of acute infection, abx not indicated at this time - RX needs prednisone taper today and trial Symbicort 80 two puffs twice day (sample give and RX sent) - Instructed patient to use spacer with inhaler  - FU in April or sooner if symptoms do not improve or worsen

## 2018-05-12 NOTE — Patient Instructions (Addendum)
RX: Prednisone taper as prescribed  Start Symbicort 80, take two puffs twice daily (2 week sample given and RX sent) Use spacer with inhalers   Recommendations: Regular mucinex twice daily for congestion  Albuterol rescue inhaler, 2 puffs every 4-6 hours as needed for sob/wheezing   Return sooner if symptoms do not improve or worsen Otherwise we will see you in April

## 2018-05-31 ENCOUNTER — Other Ambulatory Visit: Payer: Self-pay

## 2018-05-31 DIAGNOSIS — D485 Neoplasm of uncertain behavior of skin: Secondary | ICD-10-CM | POA: Diagnosis not present

## 2018-05-31 DIAGNOSIS — L820 Inflamed seborrheic keratosis: Secondary | ICD-10-CM | POA: Diagnosis not present

## 2018-05-31 DIAGNOSIS — L570 Actinic keratosis: Secondary | ICD-10-CM | POA: Diagnosis not present

## 2018-06-01 DIAGNOSIS — N6324 Unspecified lump in the left breast, lower inner quadrant: Secondary | ICD-10-CM | POA: Diagnosis not present

## 2018-07-22 ENCOUNTER — Ambulatory Visit: Payer: No Typology Code available for payment source | Admitting: Adult Health

## 2018-08-05 DIAGNOSIS — E559 Vitamin D deficiency, unspecified: Secondary | ICD-10-CM | POA: Diagnosis not present

## 2018-10-07 ENCOUNTER — Other Ambulatory Visit: Payer: Self-pay

## 2018-10-07 ENCOUNTER — Encounter: Payer: Self-pay | Admitting: Adult Health

## 2018-10-07 ENCOUNTER — Ambulatory Visit (INDEPENDENT_AMBULATORY_CARE_PROVIDER_SITE_OTHER): Payer: Medicare Other | Admitting: Adult Health

## 2018-10-07 DIAGNOSIS — J449 Chronic obstructive pulmonary disease, unspecified: Secondary | ICD-10-CM

## 2018-10-07 DIAGNOSIS — F17200 Nicotine dependence, unspecified, uncomplicated: Secondary | ICD-10-CM | POA: Diagnosis not present

## 2018-10-07 MED ORDER — SPIRIVA RESPIMAT 2.5 MCG/ACT IN AERS: 2.5000 ug | INHALATION_SPRAY | Freq: Every day | RESPIRATORY_TRACT | 0 refills | Status: DC

## 2018-10-07 MED ORDER — SPIRIVA RESPIMAT 2.5 MCG/ACT IN AERS: 2.0000 | INHALATION_SPRAY | Freq: Every day | RESPIRATORY_TRACT | 3 refills | Status: DC

## 2018-10-07 NOTE — Patient Instructions (Signed)
Begin Spiriva Respimat 2 puffs daily , rinse after use  Please call back if not covered or affordable with your insurance .  Activity as tolerated.  Work on not smoking .  Follow up with Dr. Elsworth Soho  Or Alianna Wurster NP in 6 months and As needed

## 2018-10-07 NOTE — Addendum Note (Signed)
Addended by: Jannette Spanner on: 10/07/2018 10:40 AM   Modules accepted: Orders

## 2018-10-07 NOTE — Addendum Note (Signed)
Addended by: Jannette Spanner on: 10/07/2018 03:21 PM   Modules accepted: Orders

## 2018-10-07 NOTE — Assessment & Plan Note (Signed)
Seems to be doing well with no recent flare and no increased symptom burden.  Will try Spiriva as she believes this is on her formulary -as she was hospitalized in last 6 months and has restarted smoking .  If not able to afford will cont on Albuterol As needed    Plan  Patient Instructions  Begin Spiriva Respimat 2 puffs daily , rinse after use  Please call back if not covered or affordable with your insurance .  Activity as tolerated.  Work on not smoking .  Follow up with Dr. Elsworth Soho  Or Skyler Dusing NP in 6 months and As needed

## 2018-10-07 NOTE — Assessment & Plan Note (Signed)
Smoking cessation discussed 

## 2018-10-07 NOTE — Progress Notes (Signed)
@Patient  ID: Heidi Ramirez, female    DOB: 1950-06-22, 68 y.o.   MRN: [id number]  Chief Complaint  Patient presents with  . Follow-up    COPD     Referring provider: Jonathon Jordan, MD  HPI: 68 year old female active smoker followed for COPD seen for initial pulmonary consult January 2020 Medical history significant for hypertension, melanoma status post resection TEST/EVENTS :  January 2020 spirometry showed ratio of 79, FEV1 of 60% FVC of 59%, surprisingly not much of obstruction  Hospitalization 03/19/2018 COPD exacerbation   10/07/2018 Follow up : COPD  Patient returns for a 43-month follow-up . Patient says overall breathing is doing okay with no flare of cough or wheezing . Denies dyspnea.  She was started on Symbicort last visit.  Says that she took the sample but did not fill this prescription due to not on her formulary .  Patient has restarted smoking, discussed smoking cessation. Quit smoking for 4 months . Has Family stress.  Discussed stress reducers. Declined chantix .  Uses albuterol rarely .  Retired, active at home. Does house /yard work. Does not feel she is limited by her breathing.  Previously on BREO but caused hoarseness.  Previously tried Spiriva sample , does not remember if this helped her.  Was admitted 02/2018 for COPD exacerbation .     No Known Allergies  Immunization History  Administered Date(s) Administered  . Influenza, High Dose Seasonal PF 03/26/2018    Past Medical History:  Diagnosis Date  . Heart murmur    since birth  . HTN (hypertension)   . 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 Packs/day: 0.50  . Types: Cigarettes  . Last attempt to quit: 04/01/2018  . Years since quitting: 0.5  Smokeless Tobacco Never Used   Ready to quit: No Counseling given: Yes   Outpatient Medications Prior to Visit  Medication Sig Dispense Refill  . albuterol  (PROVENTIL HFA;VENTOLIN HFA) 108 (90 Base) MCG/ACT inhaler Inhale 2 puffs into the lungs every 6 (six) hours as needed for wheezing or shortness of breath. 1 Inhaler 0  . amLODipine (NORVASC) 2.5 MG tablet Take 2.5 mg by mouth daily.    Marland Kitchen Spacer/Aero-Holding Chambers (AEROCHAMBER MV) inhaler Use as instructed 1 each 0  . fluticasone (FLONASE) 50 MCG/ACT nasal spray Place 2 sprays into both nostrils daily.    . fluticasone furoate-vilanterol (BREO ELLIPTA) 200-25 MCG/INH AEPB Inhale 1 puff into the lungs daily.    . predniSONE (DELTASONE) 10 MG tablet Take 4 tabs po daily x 2 days; then 3 tabs for 2 days; then 2 tabs for 2 days; then 1 tab for 2 days 20 tablet 0  . budesonide-formoterol (SYMBICORT) 80-4.5 MCG/ACT inhaler Inhale 2 puffs into the lungs 2 (two) times daily. (Patient not taking: Reported on 10/07/2018) 1 Inhaler 12  . budesonide-formoterol (SYMBICORT) 80-4.5 MCG/ACT inhaler Inhale 2 puffs into the lungs 2 (two) times daily for 1 day. 1 Inhaler 0   No facility-administered medications prior to visit.      Review of Systems:   Constitutional:   No  weight loss, night sweats,  Fevers, chills, fatigue, or  lassitude.  HEENT:   No headaches,  Difficulty swallowing,  Tooth/dental problems, or  Sore throat,                No sneezing, itching, ear ache, nasal congestion, post nasal drip,  CV:  No chest pain,  Orthopnea, PND, swelling in lower extremities, anasarca, dizziness, palpitations, syncope.   GI  No heartburn, indigestion, abdominal pain, nausea, vomiting, diarrhea, change in bowel habits, loss of appetite, bloody stools.   Resp: No shortness of breath with exertion or at rest.  No excess mucus, no productive cough,  No non-productive cough,  No coughing up of blood.  No change in color of mucus.  No wheezing.  No chest wall deformity  Skin: no rash or lesions.  GU: no dysuria, change in color of urine, no urgency or frequency.  No flank pain, no hematuria   MS:  No joint  pain or swelling.  No decreased range of motion.  No back pain.    Physical Exam  BP 126/80 (BP Location: Left Arm, Cuff Size: Normal)   Pulse 82   Temp 98.4 F (36.9 C) (Oral)   Ht 5\' 6"  (1.676 m)   Wt 159 lb 12.8 oz (72.5 kg)   SpO2 96%   BMI 25.79 kg/m   GEN: A/Ox3; pleasant , NAD, well nourished    HEENT:  [place]/AT,   NOSE-clear, THROAT-clear, no lesions, no postnasal drip or exudate noted.   NECK:  Supple w/ fair ROM; no JVD; normal carotid impulses w/o bruits; no thyromegaly or nodules palpated; no lymphadenopathy.    RESP  Clear  P & A; w/o, wheezes/ rales/ or rhonchi. no accessory muscle use, no dullness to percussion  CARD:  RRR, no m/r/g, no peripheral edema, pulses intact, no cyanosis or clubbing.  GI:   Soft & nt; nml bowel sounds; no organomegaly or masses detected.   Musco: Warm bil, no deformities or joint swelling noted.   Neuro: alert, no focal deficits noted.    Skin: Warm, no lesions or rashes    Lab Results:  CBC  BMET  BNP  ProBNP No results found for: PROBNP  Imaging: No results found.    No flowsheet data found.  No results found for: NITRICOXIDE      Assessment & Plan:   COPD (chronic obstructive pulmonary disease) ([place]) Seems to be doing well with no recent flare and no increased symptom burden.  Will try Spiriva as she believes this is on her formulary -as she was hospitalized in last 6 months and has restarted smoking .  If not able to afford will cont on Albuterol As needed    Plan  Patient Instructions  Begin Spiriva Respimat 2 puffs daily , rinse after use  Please call back if not covered or affordable with your insurance .  Activity as tolerated.  Work on not smoking .  Follow up with Dr. Elsworth Soho  Or Cyntha Brickman NP in 6 months and As needed        Smoking Smoking cessation discussed      Rexene Edison, NP 10/07/2018

## 2018-12-16 DIAGNOSIS — Z23 Encounter for immunization: Secondary | ICD-10-CM | POA: Diagnosis not present

## 2018-12-28 ENCOUNTER — Encounter: Payer: Self-pay | Admitting: Adult Health

## 2018-12-28 ENCOUNTER — Ambulatory Visit (INDEPENDENT_AMBULATORY_CARE_PROVIDER_SITE_OTHER): Payer: Medicare Other | Admitting: Adult Health

## 2018-12-28 ENCOUNTER — Other Ambulatory Visit: Payer: Self-pay

## 2018-12-28 DIAGNOSIS — J441 Chronic obstructive pulmonary disease with (acute) exacerbation: Secondary | ICD-10-CM | POA: Diagnosis not present

## 2018-12-28 DIAGNOSIS — F17200 Nicotine dependence, unspecified, uncomplicated: Secondary | ICD-10-CM

## 2018-12-28 MED ORDER — PREDNISONE 10 MG PO TABS: ORAL_TABLET | ORAL | 0 refills | Status: DC

## 2018-12-28 MED ORDER — DOXYCYCLINE HYCLATE 100 MG PO TABS: 100.0000 mg | ORAL_TABLET | Freq: Two times a day (BID) | ORAL | 0 refills | Status: DC

## 2018-12-28 NOTE — Progress Notes (Signed)
Virtual Visit via Telephone Note  I connected with Heidi Ramirez on 12/28/18 at 11:30 AM EDT by telephone and verified that I am speaking with the correct person using two identifiers.  Location: Patient: Home  Provider: Office    I discussed the limitations, risks, security and privacy concerns of performing an evaluation and management service by telephone and the availability of in person appointments. I also discussed with the patient that there may be a patient responsible charge related to this service. The patient expressed understanding and agreed to proceed.   History of Present Illness: 68 year old female active smoker followed for COPD.  Seen for initial pulmonary consult January 2020 History significant for hypertension, melanoma status post resection  Today's tele-visit is for an acute office visit for cough. Patient is an active smoker with underlying COPD.  She complains over the last week she has had increased cough, minimally productive  shortness of breath and wheezing. Has chest tightness and wheezing . No fever.  Retired. Lives with son, he is well with no symptoms . No known sick contacts.  Says she is being careful with mask and social distancing.  Increased Albuterol use last few days with some help.   She says she ran out of Symbicort . Insurance no longer covers. Taking Spiriva daily. Does not have formulary . Breo previously caused hoarseness.   Patient does continue to smoke.  Smoking cessation was discussed.   Observations/Objective: January 2020 spirometry showed ratio of 79, FEV1 of 60% FVC of 59%, surprisingly not much of obstruction  Hospitalization 03/19/2018 COPD exacerbation  CT chest December 2019 lungs with no infiltrates or effusions slight peribronchial thickening aortic atherosclerosis  Assessment and Plan: COPD Flare  Look at formulary on return   Tobacco Abuse - smoking cessation   Plan  Patient Instructions  Begin Doxycycline 100  mg twice daily for 1 week, take with food Use sunscreen while taking if outside in the sun Mucinex DM twice daily as needed for cough and congestion Prednisone taper of the next week, take with food Continue on Spiriva daily Please obtain your medication formulary from your insurance Please work on cutting back and quitting smoking. Fluids and rest Activity as tolerated Follow-up in the office in 4 weeks with Dr. Elsworth Soho  Or Parrett NP and as needed Please contact office for sooner follow up if symptoms do not improve or worsen or seek emergency care       Follow Up Instructions: Follow-up in 4 weeks and as needed Please contact office for sooner follow up if symptoms do not improve or worsen or seek emergency care     I discussed the assessment and treatment plan with the patient. The patient was provided an opportunity to ask questions and all were answered. The patient agreed with the plan and demonstrated an understanding of the instructions.   The patient was advised to call back or seek an in-person evaluation if the symptoms worsen or if the condition fails to improve as anticipated.  I provided 21 minutes of non-face-to-face time during this encounter.   Rexene Edison, NP

## 2018-12-28 NOTE — Patient Instructions (Signed)
Begin Doxycycline 100 mg twice daily for 1 week, take with food Use sunscreen while taking if outside in the sun Mucinex DM twice daily as needed for cough and congestion Prednisone taper of the next week, take with food Continue on Spiriva daily Please obtain your medication formulary from your insurance Please work on cutting back and quitting smoking. Fluids and rest Activity as tolerated Follow-up in the office in 4 weeks with Dr. Elsworth Soho  Or Parrett NP and as needed Please contact office for sooner follow up if symptoms do not improve or worsen or seek emergency care

## 2019-01-20 ENCOUNTER — Telehealth: Payer: Self-pay | Admitting: Pulmonary Disease

## 2019-01-20 ENCOUNTER — Other Ambulatory Visit: Payer: Self-pay | Admitting: Adult Health

## 2019-01-20 MED ORDER — SPIRIVA RESPIMAT 2.5 MCG/ACT IN AERS: 2.0000 | INHALATION_SPRAY | Freq: Every day | RESPIRATORY_TRACT | 3 refills | Status: DC

## 2019-01-20 NOTE — Telephone Encounter (Signed)
Called and spoke to pt. Pt states she is having the same symptoms as she did when she did a televisit with TP on 9/29 for a COPD exacerbation. Pt states she got better after the doxy and pred but over the last 3 days her s/s have returned. Pt c/o increase in SOB, nonprod cough, chest congestion, muscle aches from coughing. Pt denies CP/tightness, f/c/s, covid contact. Pt has an appt with Dr. Elsworth Soho on 10/28 for her 4 week f/u but thinks she may need abx prior to that visit.   Dr. Elsworth Soho, please advise. Thanks.

## 2019-01-20 NOTE — Telephone Encounter (Signed)
Please get her in tomorrow for office visit with APP -she will likely need chest x-ray and I can see her with APP if needed

## 2019-01-20 NOTE — Telephone Encounter (Signed)
Called and spoke to pt. Appt has been made with Derl Barrow, NP, at 3pm. Pt verbalized understanding and denied any further questions or concerns at this time.   Will send to RA as FYI.

## 2019-01-21 ENCOUNTER — Encounter: Payer: Self-pay | Admitting: Primary Care

## 2019-01-21 ENCOUNTER — Ambulatory Visit (INDEPENDENT_AMBULATORY_CARE_PROVIDER_SITE_OTHER): Payer: Medicare Other | Admitting: Primary Care

## 2019-01-21 ENCOUNTER — Other Ambulatory Visit: Payer: Self-pay

## 2019-01-21 ENCOUNTER — Ambulatory Visit (INDEPENDENT_AMBULATORY_CARE_PROVIDER_SITE_OTHER): Payer: Medicare Other

## 2019-01-21 VITALS — BP 158/80 | HR 82 | Temp 98.3°F | Ht 66.0 in | Wt 152.0 lb

## 2019-01-21 DIAGNOSIS — J441 Chronic obstructive pulmonary disease with (acute) exacerbation: Secondary | ICD-10-CM | POA: Diagnosis not present

## 2019-01-21 DIAGNOSIS — J449 Chronic obstructive pulmonary disease, unspecified: Secondary | ICD-10-CM

## 2019-01-21 DIAGNOSIS — I700 Atherosclerosis of aorta: Secondary | ICD-10-CM | POA: Diagnosis not present

## 2019-01-21 MED ORDER — METHYLPREDNISOLONE ACETATE 80 MG/ML IJ SUSP
80.0000 mg | Freq: Once | INTRAMUSCULAR | Status: AC
  Administered 2019-01-21: 16:00:00 80 mg via INTRAMUSCULAR

## 2019-01-21 MED ORDER — SPIRIVA RESPIMAT 2.5 MCG/ACT IN AERS: 2.0000 | INHALATION_SPRAY | Freq: Every day | RESPIRATORY_TRACT | 0 refills | Status: DC

## 2019-01-21 MED ORDER — PREDNISONE 10 MG PO TABS: ORAL_TABLET | ORAL | 0 refills | Status: DC

## 2019-01-21 NOTE — Patient Instructions (Addendum)
Recommendations: Continue Spiriva two puffs once daily Continue Albuterol 2 puffs every 4-6 hours for shortness of breath/wheezing Take mucinex 600mg  tablet and delsym cough syrup twice daily  Orders: CXR today at [facility] ([address] [place]- open Saturday until 12noon)  Office treatment: 80mg  Depo-medrol IM x1   RX: Prednisone taper as prescribed   Follow-up: 1 week office or video visit with Eustaquio Maize NP

## 2019-01-21 NOTE — Assessment & Plan Note (Addendum)
-   Reports sob, wheezing and np cough, treated for COPD exacerbation in September with some improvement  - Audible expiratory wheezing throughout lungs - Received 60mg  depo-medrol IM injection in office - RX prednisone taper (40mg  x 3 days; 30mg  x 3 days; 20mg  x 3 days; 10mg  x 3 days) - No further antibiotic indicated at this time  - Continue Spiriva two puffs once daily; Albuterol hfa2 puffs every 4-6 hours prn sob/wheezing - CXR showed no acute cardiopulmonary process - Advised mucinex 600mg  tablet and delsym cough syrup twice daily - Check COVID test

## 2019-01-21 NOTE — Progress Notes (Signed)
@Patient  ID: Heidi Ramirez, female    DOB: May 01, 1950, 68 y.o.   MRN: [id number]  Chief Complaint  Patient presents with  . Acute Visit    Reports increased SOB and wheezing and cough with green mucous since wednesday.     Referring provider: Jonathon Jordan, MD  HPI: 68 year old female, current every day smoker (Quit in December 2019 but resumed). PMH significant for COPD, hypertension, melanoma s/p resection. Patient of Dr. Elsworth Soho, last seen by pulmonary NP on 12/28/18 for COPD exacerbation treated with doxycycline. Previously tried [employer], Symbicort. Maintained on Spiriva respimat.   01/21/2019 Patient presents today for acute visit. Reports increased shortness of breath, non-productive cough, chest congestion. She was recently treated for COPD exacerbation in September with Doxycycline and prednisone, reports improvement x1 week and then symptoms returned. Cough has improved some, cough is mostly dry with small amount of green mucus. Some labored breathing. O2 95% on RA today. States that at home its running 88-92%. Wheezing is a little better today. Using Spiriva respimat once daily. Requiring rescue inhaler every 2-3 hours. She does not have nebulizer. Still smoking 1-2 cigarettes every few days.    TEST/EVENTS :  January 2020 spirometry showed ratio of 79, FEV1 of 60% FVC of 59%, surprisingly not much of obstruction  CT chest December 2019 lungs with no infiltrates or effusions slight peribronchial thickening aortic atherosclerosis  No Known Allergies  Immunization History  Administered Date(s) Administered  . Influenza, High Dose Seasonal PF 03/26/2018, 12/14/2018  . Pneumococcal Polysaccharide-23 12/14/2018    Past Medical History:  Diagnosis Date  . Heart murmur    since birth  . HTN (hypertension)   . 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 Packs/day: 0.50  . Types: Cigarettes  . Last  attempt to quit: 04/01/2018  . Years since quitting: 0.8  Smokeless Tobacco Never Used   Ready to quit: Not Answered Counseling given: Not Answered   Outpatient Medications Prior to Visit  Medication Sig Dispense Refill  . albuterol (PROVENTIL HFA;VENTOLIN HFA) 108 (90 Base) MCG/ACT inhaler Inhale 2 puffs into the lungs every 6 (six) hours as needed for wheezing or shortness of breath. 1 Inhaler 0  . amLODipine (NORVASC) 2.5 MG tablet Take 2.5 mg by mouth daily.    Marland Kitchen Spacer/Aero-Holding Chambers (AEROCHAMBER MV) inhaler Use as instructed 1 each 0  . Tiotropium Bromide Monohydrate (SPIRIVA RESPIMAT) 2.5 MCG/ACT AERS Inhale 2 puffs into the lungs daily. 4 g 3  . budesonide-formoterol (SYMBICORT) 80-4.5 MCG/ACT inhaler Inhale 2 puffs into the lungs 2 (two) times daily. (Patient not taking: Reported on 10/07/2018) 1 Inhaler 12  . doxycycline (VIBRA-TABS) 100 MG tablet Take 1 tablet (100 mg total) by mouth 2 (two) times daily. (Patient not taking: Reported on 01/21/2019) 14 tablet 0  . predniSONE (DELTASONE) 10 MG tablet 4 tabs for 2 days, then 3 tabs for 2 days, 2 tabs for 2 days, then 1 tab for 2 days, then stop (Patient not taking: Reported on 01/21/2019) 20 tablet 0   No facility-administered medications prior to visit.    Review of Systems  Review of Systems  Constitutional: Negative for chills and fever.  Respiratory: Positive for cough, shortness of breath and wheezing.   Cardiovascular: Negative.    Physical Exam  BP (!) 158/80   Pulse 82   Temp 98.3 F (36.8 C) (Temporal)   Ht 5'  6" (1.676 m)   Wt 152 lb (68.9 kg)   SpO2 95%   BMI 24.53 kg/m  Physical Exam Constitutional:      General: She is not in acute distress.    Appearance: Normal appearance. She is ill-appearing.  Neck:     Musculoskeletal: Normal range of motion and neck supple.  Cardiovascular:     Rate and Rhythm: Normal rate and regular rhythm.  Pulmonary:     Effort: No respiratory distress.     Breath  sounds: No stridor. Wheezing and rhonchi present.     Comments: Audible expiratory wheezing throughout lung fields Skin:    General: Skin is warm and dry.  Neurological:     General: No focal deficit present.     Mental Status: She is alert and oriented to person, place, and time. Mental status is at baseline.  Psychiatric:        Mood and Affect: Mood normal.        Behavior: Behavior normal.        Thought Content: Thought content normal.        Judgment: Judgment normal.      Lab Results:  CBC    Component Value Date/Time   WBC 11.0 (H) 03/25/2018 1527   RBC 5.05 03/25/2018 1527   HGB 15.1 (H) 03/25/2018 1527   HCT 47.8 (H) 03/25/2018 1527   PLT 436 (H) 03/25/2018 1527   MCV 94.7 03/25/2018 1527   MCH 29.9 03/25/2018 1527   MCHC 31.6 03/25/2018 1527   RDW 13.0 03/25/2018 1527   LYMPHSABS 0.8 03/25/2018 1527   MONOABS 0.5 03/25/2018 1527   EOSABS 0.3 03/25/2018 1527   BASOSABS 0.1 03/25/2018 1527    BMET    Component Value Date/Time   NA 139 03/25/2018 1527   K 4.4 03/25/2018 1527   CL 104 03/25/2018 1527   CO2 24 03/25/2018 1527   GLUCOSE 108 (H) 03/25/2018 1527   BUN 10 03/25/2018 1527   CREATININE 0.83 03/25/2018 1527   CALCIUM 9.4 03/25/2018 1527   GFRNONAA >60 03/25/2018 1527   GFRAA >60 03/25/2018 1527    BNP    Component Value Date/Time   BNP 84.5 03/25/2018 1527    ProBNP No results found for: PROBNP  Imaging: Dg Chest 2 View  Result Date: 01/21/2019 CLINICAL DATA:  68 year old female with history of COPD exacerbation. EXAM: CHEST - 2 VIEW COMPARISON:  Chest x-ray 03/25/2018. FINDINGS: Lung volumes are normal. No consolidative airspace disease. No pleural effusions. No pneumothorax. No pulmonary nodule or mass noted. Pulmonary vasculature and the cardiomediastinal silhouette are within normal limits. Atherosclerosis in the thoracic aorta. Surgical clips project over the right upper quadrant of the abdomen. IMPRESSION: 1.  No radiographic  evidence of acute cardiopulmonary disease. 2. Aortic atherosclerosis. Electronically Signed   By: Vinnie Langton M.D.   On: 01/21/2019 16:35     Assessment & Plan:   COPD with acute exacerbation ([place]) - Reports sob, wheezing and np cough, treated for COPD exacerbation in September with some improvement  - Audible expiratory wheezing throughout lungs - Received 60mg  depo-medrol IM injection in office - RX prednisone taper (40mg  x 3 days; 30mg  x 3 days; 20mg  x 3 days; 10mg  x 3 days) - No further antibiotic indicated at this time  - Continue Spiriva two puffs once daily; Albuterol hfa2 puffs every 4-6 hours prn sob/wheezing - CXR showed no acute cardiopulmonary process - Advised mucinex 600mg  tablet and delsym cough syrup twice daily - Check  COVID test   Martyn Ehrich, NP 01/21/2019

## 2019-01-24 ENCOUNTER — Other Ambulatory Visit: Payer: Self-pay | Admitting: *Deleted

## 2019-01-24 DIAGNOSIS — Z20828 Contact with and (suspected) exposure to other viral communicable diseases: Secondary | ICD-10-CM | POA: Diagnosis not present

## 2019-01-24 DIAGNOSIS — Z20822 Contact with and (suspected) exposure to covid-19: Secondary | ICD-10-CM

## 2019-01-25 LAB — NOVEL CORONAVIRUS, NAA: SARS-CoV-2, NAA: NOT DETECTED

## 2019-01-26 ENCOUNTER — Encounter: Payer: Self-pay | Admitting: Pulmonary Disease

## 2019-01-26 ENCOUNTER — Ambulatory Visit (INDEPENDENT_AMBULATORY_CARE_PROVIDER_SITE_OTHER): Payer: Medicare Other | Admitting: Pulmonary Disease

## 2019-01-26 ENCOUNTER — Other Ambulatory Visit: Payer: Self-pay

## 2019-01-26 DIAGNOSIS — J441 Chronic obstructive pulmonary disease with (acute) exacerbation: Secondary | ICD-10-CM | POA: Diagnosis not present

## 2019-01-26 DIAGNOSIS — F17200 Nicotine dependence, unspecified, uncomplicated: Secondary | ICD-10-CM | POA: Diagnosis not present

## 2019-01-26 MED ORDER — STIOLTO RESPIMAT 2.5-2.5 MCG/ACT IN AERS: 2.0000 | INHALATION_SPRAY | Freq: Every day | RESPIRATORY_TRACT | 0 refills | Status: DC

## 2019-01-26 NOTE — Assessment & Plan Note (Addendum)
Feel that trigger your may be fall related allergies/sinus congestion and continued smoking Start taking Zyrtec 10 mg daily. Take Mucinex only if you have productive sputum.  Change from Spiriva to Stiolto Respimat 2 puffs daily-call me for prescription in 2 weeks if this helps  Call if worse

## 2019-01-26 NOTE — Assessment & Plan Note (Signed)
Smoking cessation again emphasized is the most important intervention 

## 2019-01-26 NOTE — Patient Instructions (Signed)
Start taking Zyrtec 10 mg daily. Take Mucinex only if you have productive sputum.  Change from Spiriva to Stiolto Respimat 2 puffs daily-call me for prescription in 2 weeks if this helps  Call if worse

## 2019-01-26 NOTE — Progress Notes (Signed)
   Subjective:    Patient ID: Heidi Ramirez, female    DOB: 1950/12/05, 68 y.o.   MRN: [id number]  HPI  68 year old smoker for follow-up of COPD  Hospitalization 03/19/2018 COPD exacerbation She has been maintained on Spiriva  Had a flareup 11/2018 and got doxycycline and prednisone -reviewed televisit She again had an office visit 10/23 for shortness of breath and wheezing -given Depo-Medrol in the office and prednisone taper, no antibiotic, Covid test was negative Chest x-ray 10/23 personally reviewed does not show any infiltrates or effusions  She is mildly improved from last week but when she measures her oxygen level of these continue to run 89 to 93%, 93% on room air today.  Reports occasional wheezing and has a lot of sinus drainage  She unfortunately has relapsed with her smoking and is up to 2 cigarettes a day Flu shot up-to-date  Significant tests/ events reviewed  January 2020 spirometry showed ratio of 79, FEV1 of 60% FVC of 59%, surprisingly not much of obstruction  CT chest December 2019 lungs with no infiltrates or effusions slight peribronchial thickening aortic atherosclerosis  Review of Systems neg for any significant sore throat, dysphagia, itching, sneezing, nasal congestion or excess/ purulent secretions, fever, chills, sweats, unintended wt loss, pleuritic or exertional cp, hempoptysis, orthopnea pnd or change in chronic leg swelling. Also denies presyncope, palpitations, heartburn, abdominal pain, nausea, vomiting, diarrhea or change in bowel or urinary habits, dysuria,hematuria, rash, arthralgias, visual complaints, headache, numbness weakness or ataxia.     Objective:   Physical Exam   Gen. Pleasant, well-nourished, in no distress ENT - no thrush, no pallor/icterus,no post nasal drip Neck: No JVD, no thyromegaly, no carotid bruits Lungs: no use of accessory muscles, no dullness to percussion, clear without rales or rhonchi  Cardiovascular: Rhythm  regular, heart sounds  normal, no murmurs or gallops, no peripheral edema Musculoskeletal: No deformities, no cyanosis or clubbing          Assessment & Plan:  f

## 2019-01-26 NOTE — Addendum Note (Signed)
Addended by: Amado Coe on: 01/26/2019 09:53 AM   Modules accepted: Orders

## 2019-02-07 ENCOUNTER — Telehealth: Payer: Self-pay | Admitting: Pulmonary Disease

## 2019-02-07 MED ORDER — STIOLTO RESPIMAT 2.5-2.5 MCG/ACT IN AERS: 2.0000 | INHALATION_SPRAY | Freq: Every day | RESPIRATORY_TRACT | 5 refills | Status: DC

## 2019-02-07 NOTE — Telephone Encounter (Signed)
Spoke with pt, states the Stiolto samples she was given work well for her, would like a rx sent to her pharmacy.  This has been sent.  Nothing further needed at this time- will close encounter.

## 2019-04-11 ENCOUNTER — Other Ambulatory Visit: Payer: Self-pay

## 2019-04-11 ENCOUNTER — Encounter: Payer: Self-pay | Admitting: Adult Health

## 2019-04-11 ENCOUNTER — Ambulatory Visit (INDEPENDENT_AMBULATORY_CARE_PROVIDER_SITE_OTHER): Payer: Medicare Other | Admitting: Adult Health

## 2019-04-11 VITALS — BP 138/70 | HR 78 | Temp 97.5°F | Ht 66.0 in | Wt 152.0 lb

## 2019-04-11 DIAGNOSIS — J449 Chronic obstructive pulmonary disease, unspecified: Secondary | ICD-10-CM

## 2019-04-11 DIAGNOSIS — J310 Chronic rhinitis: Secondary | ICD-10-CM

## 2019-04-11 DIAGNOSIS — F17200 Nicotine dependence, unspecified, uncomplicated: Secondary | ICD-10-CM

## 2019-04-11 MED ORDER — ALBUTEROL SULFATE HFA 108 (90 BASE) MCG/ACT IN AERS: 2.0000 | INHALATION_SPRAY | Freq: Four times a day (QID) | RESPIRATORY_TRACT | 3 refills | Status: DC | PRN

## 2019-04-11 NOTE — Assessment & Plan Note (Signed)
Add saline nasal spray and gel as needed

## 2019-04-11 NOTE — Patient Instructions (Addendum)
Refer to LDCT Screening program with Anselm Lis NP  Continue on Stiolto 2 puffs daily  Activity as tolerated.  Work on not smoking  Mucinex Twice daily  As needed  Cough/congestion  Zyrtec 10mg  At bedtime  As needed  Nasal drainage.  Saline nasal spray and gel As needed   Follow up with Dr. Elsworth Soho  In 4 months and As needed

## 2019-04-11 NOTE — Assessment & Plan Note (Signed)
Smoking cessation discussed 

## 2019-04-11 NOTE — Progress Notes (Signed)
@Patient  ID: Heidi Ramirez, female    DOB: [birth year], 69 y.o.   MRN: [id number]  Chief Complaint  Patient presents with  . Follow-up    COPD     Referring provider: Jonathon Jordan, MD  HPI: 69 year old female active smoker followed for COPD.  Seen for initial pulmonary consult January 2020 Medical history significant for hypertension, melanoma status post resection  TEST/EVENTS :  Spirometry January 2020 FEV1 60%, ratio 79, FVC 59% CT chest December 2019 slight peribronchial thickening.  Hospitalization December 2019 COPD exacerbation  04/11/2019 Follow up : COPD  Patient presents for 49-month follow-up.  Last visit patient was changed from [place] to [employer].  Patient says she is doing well.     She says she tries to stay active with her housework and she tries to walk at least 3 times a week.  She does have a daily cough that is worse in the morning.  She has cut back on her smoking down to 2 cigarettes a day.  We discussed smoking cessation. We discussed the low-dose CT screening program.  Patient is interested.  Last chest x-ray October 2020 with no acute process.  She did noticed 2 weeks ago that she had some increased cough and congestion and some intermittent wheezing.  Symptoms lasted for several days but have slowly improved and she is back to her baseline.  She denies any fever, discolored mucus, chest pain, orthopnea, loss of taste or smell.  No known sick contacts.  No Known Allergies  Immunization History  Administered Date(s) Administered  . Influenza, High Dose Seasonal PF 03/26/2018, 12/14/2018  . Pneumococcal Polysaccharide-23 12/14/2018    Past Medical History:  Diagnosis Date  . Heart murmur    since birth  . HTN (hypertension)   . 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 Packs/day: 0.25  . Years: 59.00  . Pack years: 14.75  . Types: Cigarettes  . Start date: 1961    Smokeless Tobacco Never Used   Ready to quit: Yes Counseling given: Yes   Outpatient Medications Prior to Visit  Medication Sig Dispense Refill  . amLODipine (NORVASC) 2.5 MG tablet Take 2.5 mg by mouth daily.    Marland Kitchen Spacer/Aero-Holding Chambers (AEROCHAMBER MV) inhaler Use as instructed 1 each 0  . Tiotropium Bromide-Olodaterol (STIOLTO RESPIMAT) 2.5-2.5 MCG/ACT AERS Inhale 2 puffs into the lungs daily. 4 g 5  . albuterol (PROVENTIL HFA;VENTOLIN HFA) 108 (90 Base) MCG/ACT inhaler Inhale 2 puffs into the lungs every 6 (six) hours as needed for wheezing or shortness of breath. 1 Inhaler 0  . predniSONE (DELTASONE) 10 MG tablet Take 4 tabs po daily x 3 days; then 3 tabs daily x3 days; then 2 tabs daily x3 days; then 1 tab daily x 3 days; then stop 30 tablet 0  . Tiotropium Bromide Monohydrate (SPIRIVA RESPIMAT) 2.5 MCG/ACT AERS Inhale 2 puffs into the lungs daily. 4 g 3   No facility-administered medications prior to visit.     Review of Systems:   Constitutional:   No  weight loss, night sweats,  Fevers, chills,  +fatigue, or  lassitude.  HEENT:   No headaches,  Difficulty swallowing,  Tooth/dental problems, or  Sore throat,                No sneezing, itching, ear ache,  +nasal congestion, post nasal drip,   CV:  No chest pain,  Orthopnea, PND, swelling in lower extremities, anasarca, dizziness, palpitations, syncope.   GI  No heartburn, indigestion, abdominal pain, nausea, vomiting, diarrhea, change in bowel habits, loss of appetite, bloody stools.   Resp:    No chest wall deformity  Skin: no rash or lesions.  GU: no dysuria, change in color of urine, no urgency or frequency.  No flank pain, no hematuria   MS:  No joint pain or swelling.  No decreased range of motion.  No back pain.    Physical Exam  BP 138/70 (BP Location: Left Arm, Cuff Size: Normal)   Pulse 78   Temp (!) 97.5 F (36.4 C) (Oral)   Ht 5\' 6"  (1.676 m)   Wt 152 lb (68.9 kg)   SpO2 96%   BMI 24.53  kg/m   GEN: A/Ox3; pleasant , NAD    HEENT:  [place]/AT,  , NOSE-clear, THROAT-clear, no lesions, no postnasal drip or exudate noted.   NECK:  Supple w/ fair ROM; no JVD; normal carotid impulses w/o bruits; no thyromegaly or nodules palpated; no lymphadenopathy.    RESP  Clear  P & A; w/o, wheezes/ rales/ or rhonchi. no accessory muscle use, no dullness to percussion  CARD:  RRR, no m/r/g, no peripheral edema, pulses intact, no cyanosis or clubbing.  GI:   Soft & nt; nml bowel sounds; no organomegaly or masses detected.   Musco: Warm bil, no deformities or joint swelling noted.   Neuro: alert, no focal deficits noted.    Skin: Warm, no lesions or rashes    Lab Results:  CBC   BMET  BNP  ProBNP No results found for: PROBNP  Imaging: No results found.    No flowsheet data found.  No results found for: NITRICOXIDE      Assessment & Plan:   COPD (chronic obstructive pulmonary disease) (HCC) Currently stable on present regimen. Referral to the low-dose CT screening program.  Activity as tolerated.  Plan  Patient Instructions  Refer to LDCT Screening program with Anselm Lis NP  Continue on Stiolto 2 puffs daily  Activity as tolerated.  Work on not smoking  Mucinex Twice daily  As needed  Cough/congestion  Zyrtec 10mg  At bedtime  As needed  Nasal drainage.  Saline nasal spray and gel As needed   Follow up with Dr. Elsworth Soho  In 4 months and As needed         Smoking Smoking cessation discussed  Chronic rhinitis Add saline nasal spray and gel as needed   Total patient care time 31 minutes  Onika Gudiel, NP 04/11/2019

## 2019-04-11 NOTE — Assessment & Plan Note (Signed)
Currently stable on present regimen. Referral to the low-dose CT screening program.  Activity as tolerated.  Plan  Patient Instructions  Refer to LDCT Screening program with Anselm Lis NP  Continue on Stiolto 2 puffs daily  Activity as tolerated.  Work on not smoking  Mucinex Twice daily  As needed  Cough/congestion  Zyrtec 10mg  At bedtime  As needed  Nasal drainage.  Saline nasal spray and gel As needed   Follow up with Dr. Elsworth Soho  In 4 months and As needed

## 2019-04-18 ENCOUNTER — Telehealth: Payer: Self-pay | Admitting: Adult Health

## 2019-04-18 NOTE — Telephone Encounter (Signed)
FYI: Spoke with pt regarding lung cancer screening program. Talked with pt in detail regarding her smoking history and she has less than a 30 pack year history and will not qualify.  PT verbalized understanding.  Referral has been cancelled.

## 2019-04-22 ENCOUNTER — Telehealth: Payer: Self-pay | Admitting: Pulmonary Disease

## 2019-04-22 MED ORDER — ANORO ELLIPTA 62.5-25 MCG/INH IN AEPB: 1.0000 | INHALATION_SPRAY | Freq: Every day | RESPIRATORY_TRACT | 5 refills | Status: DC

## 2019-04-22 NOTE — Telephone Encounter (Signed)
TP, Stiolto is too expensive for pt, can we switch inhaler to Anoro or Bevesp[? Please advise.

## 2019-04-22 NOTE — Telephone Encounter (Signed)
Spoke with pt, aware of recs.  rx sent to preferred pharmacy.  Nothing further needed at this time- will close encounter.   

## 2019-04-22 NOTE — Telephone Encounter (Signed)
Can try ANORO 1 puff daily , rinse after use.

## 2019-05-18 ENCOUNTER — Other Ambulatory Visit: Payer: Self-pay | Admitting: Pulmonary Disease

## 2019-05-21 ENCOUNTER — Ambulatory Visit: Payer: Medicare Other | Attending: Internal Medicine

## 2019-05-21 DIAGNOSIS — Z23 Encounter for immunization: Secondary | ICD-10-CM | POA: Insufficient documentation

## 2019-05-21 NOTE — Progress Notes (Signed)
   Covid-19 Vaccination Clinic  Name:  Heidi Ramirez    MRN: [id number] DOB: 1950/11/18  05/21/2019  [name] was observed post Covid-19 immunization for 15 minutes without incidence. She was provided with Vaccine Information Sheet and instruction to access the V-Safe system.   [name] was instructed to call 911 with any severe reactions post vaccine: Marland Kitchen Difficulty breathing  . Swelling of your face and throat  . A fast heartbeat  . A bad rash all over your body  . Dizziness and weakness    Immunizations Administered    Name Date Dose VIS Date Route   Pfizer COVID-19 Vaccine 05/21/2019 11:04 AM 0.3 mL 03/11/2019 Intramuscular   Manufacturer: [place]   Lot: [id number]   [place]: [id number]

## 2019-06-13 ENCOUNTER — Ambulatory Visit: Payer: Medicare Other | Attending: Internal Medicine

## 2019-06-13 DIAGNOSIS — Z23 Encounter for immunization: Secondary | ICD-10-CM

## 2019-06-13 NOTE — Progress Notes (Signed)
   Covid-19 Vaccination Clinic  Name:  Heidi Ramirez    MRN: [id number] DOB: 11-17-1950  06/13/2019  [name] was observed post Covid-19 immunization for 15 minutes without incident. She was provided with Vaccine Information Sheet and instruction to access the V-Safe system.   [name] was instructed to call 911 with any severe reactions post vaccine: Marland Kitchen Difficulty breathing  . Swelling of face and throat  . A fast heartbeat  . A bad rash all over body  . Dizziness and weakness   Immunizations Administered    Name Date Dose VIS Date Route   Pfizer COVID-19 Vaccine 06/13/2019  9:11 AM 0.3 mL 03/11/2019 Intramuscular   Manufacturer: [place]   Lot: [id number]   [place]: [id number]

## 2019-07-08 DIAGNOSIS — I491 Atrial premature depolarization: Secondary | ICD-10-CM | POA: Diagnosis not present

## 2019-07-08 DIAGNOSIS — I700 Atherosclerosis of aorta: Secondary | ICD-10-CM | POA: Diagnosis not present

## 2019-07-08 DIAGNOSIS — I10 Essential (primary) hypertension: Secondary | ICD-10-CM | POA: Diagnosis not present

## 2019-07-08 DIAGNOSIS — Z0000 Encounter for general adult medical examination without abnormal findings: Secondary | ICD-10-CM | POA: Diagnosis not present

## 2019-07-08 DIAGNOSIS — Z1211 Encounter for screening for malignant neoplasm of colon: Secondary | ICD-10-CM | POA: Diagnosis not present

## 2019-07-08 DIAGNOSIS — I517 Cardiomegaly: Secondary | ICD-10-CM | POA: Diagnosis not present

## 2019-07-08 DIAGNOSIS — E7800 Pure hypercholesterolemia, unspecified: Secondary | ICD-10-CM | POA: Diagnosis not present

## 2019-07-08 DIAGNOSIS — C439 Malignant melanoma of skin, unspecified: Secondary | ICD-10-CM | POA: Diagnosis not present

## 2019-07-08 DIAGNOSIS — Z79899 Other long term (current) drug therapy: Secondary | ICD-10-CM | POA: Diagnosis not present

## 2019-07-08 DIAGNOSIS — E559 Vitamin D deficiency, unspecified: Secondary | ICD-10-CM | POA: Diagnosis not present

## 2019-07-08 DIAGNOSIS — J449 Chronic obstructive pulmonary disease, unspecified: Secondary | ICD-10-CM | POA: Diagnosis not present

## 2019-07-22 DIAGNOSIS — E559 Vitamin D deficiency, unspecified: Secondary | ICD-10-CM | POA: Diagnosis not present

## 2019-08-16 ENCOUNTER — Encounter: Payer: Self-pay | Admitting: Pulmonary Disease

## 2019-08-16 ENCOUNTER — Other Ambulatory Visit: Payer: Self-pay

## 2019-08-16 ENCOUNTER — Ambulatory Visit (INDEPENDENT_AMBULATORY_CARE_PROVIDER_SITE_OTHER): Payer: Medicare Other | Admitting: Pulmonary Disease

## 2019-08-16 DIAGNOSIS — J449 Chronic obstructive pulmonary disease, unspecified: Secondary | ICD-10-CM | POA: Diagnosis not present

## 2019-08-16 DIAGNOSIS — F17200 Nicotine dependence, unspecified, uncomplicated: Secondary | ICD-10-CM

## 2019-08-16 MED ORDER — ALBUTEROL SULFATE HFA 108 (90 BASE) MCG/ACT IN AERS: 2.0000 | INHALATION_SPRAY | Freq: Four times a day (QID) | RESPIRATORY_TRACT | 1 refills | Status: DC | PRN

## 2019-08-16 MED ORDER — ANORO ELLIPTA 62.5-25 MCG/INH IN AEPB: 1.0000 | INHALATION_SPRAY | Freq: Every day | RESPIRATORY_TRACT | 5 refills | Status: DC

## 2019-08-16 NOTE — Assessment & Plan Note (Signed)
Refills on Anoro and albuterol for rescue  Okay to use Mucinex 600 mg daily for increased mucus production  PFTs surprisingly do not show airway obstruction, may need to repeat spirometry pre and post

## 2019-08-16 NOTE — Assessment & Plan Note (Signed)
Please make another attempt to quit smoking !

## 2019-08-16 NOTE — Progress Notes (Signed)
   Subjective:    Patient ID: Heidi Ramirez, female    DOB: Jun 14, 1950, 69 y.o.   MRN: [id number]  HPI  69 year old smoker for follow-up of COPD  Hospitalization 03/19/2018 COPD exacerbation Last flareup 11/2018  She has done well after switching from Spiriva to LABA/LAMA combination, initially on Stiolto, now on Anoro  Turned down by lung cancer screening program, continues to smoke 3 to 4 cigarettes daily, she was able to quit for 5 months last year and then relapsed  Significant tests/ events reviewed  03/2018 spirometry  ratio of 79, FEV1 of 60% FVC of 59%, surprisingly not much of obstruction  CT chest 02/2018 lungs with no infiltrates or effusions slight peribronchial thickening aortic atherosclerosis  Review of Systems Patient denies significant dyspnea,cough, hemoptysis,  chest pain, palpitations, pedal edema, orthopnea, paroxysmal nocturnal dyspnea, lightheadedness, nausea, vomiting, abdominal or  leg pains      Objective:   Physical Exam   Gen. Pleasant, well-nourished, in no distress ENT - no thrush, no pallor/icterus,no post nasal drip Neck: No JVD, no thyromegaly, no carotid bruits Lungs: no use of accessory muscles, no dullness to percussion, clear without rales or rhonchi  Cardiovascular: Rhythm regular, heart sounds  normal, no murmurs or gallops, no peripheral edema Musculoskeletal: No deformities, no cyanosis or clubbing         Assessment & Plan:

## 2019-08-16 NOTE — Patient Instructions (Addendum)
Refills on Anoro and albuterol for rescue  Okay to use Mucinex 600 mg daily for increased mucus production  Please make another attempt to quit smoking !

## 2019-10-24 ENCOUNTER — Encounter (HOSPITAL_COMMUNITY): Payer: Self-pay

## 2019-10-24 ENCOUNTER — Emergency Department (HOSPITAL_COMMUNITY): Payer: Medicare Other

## 2019-10-24 ENCOUNTER — Other Ambulatory Visit: Payer: Self-pay

## 2019-10-24 ENCOUNTER — Inpatient Hospital Stay (HOSPITAL_COMMUNITY)
Admission: EM | Admit: 2019-10-24 | Discharge: 2019-10-29 | DRG: 190 | Disposition: A | Discharge status: Home Health | Payer: Medicare Other | Attending: Internal Medicine | Admitting: Internal Medicine

## 2019-10-24 ENCOUNTER — Telehealth: Payer: Self-pay | Admitting: Pulmonary Disease

## 2019-10-24 DIAGNOSIS — Z20822 Contact with and (suspected) exposure to covid-19: Secondary | ICD-10-CM | POA: Diagnosis not present

## 2019-10-24 DIAGNOSIS — J441 Chronic obstructive pulmonary disease with (acute) exacerbation: Secondary | ICD-10-CM | POA: Diagnosis not present

## 2019-10-24 DIAGNOSIS — R21 Rash and other nonspecific skin eruption: Secondary | ICD-10-CM | POA: Diagnosis not present

## 2019-10-24 DIAGNOSIS — F17200 Nicotine dependence, unspecified, uncomplicated: Secondary | ICD-10-CM | POA: Diagnosis present

## 2019-10-24 DIAGNOSIS — I10 Essential (primary) hypertension: Secondary | ICD-10-CM | POA: Diagnosis not present

## 2019-10-24 DIAGNOSIS — J9601 Acute respiratory failure with hypoxia: Secondary | ICD-10-CM | POA: Diagnosis not present

## 2019-10-24 DIAGNOSIS — Z79899 Other long term (current) drug therapy: Secondary | ICD-10-CM

## 2019-10-24 DIAGNOSIS — I5032 Chronic diastolic (congestive) heart failure: Secondary | ICD-10-CM | POA: Diagnosis not present

## 2019-10-24 DIAGNOSIS — Z85820 Personal history of malignant melanoma of skin: Secondary | ICD-10-CM

## 2019-10-24 DIAGNOSIS — I110 Hypertensive heart disease with heart failure: Secondary | ICD-10-CM | POA: Diagnosis present

## 2019-10-24 DIAGNOSIS — J439 Emphysema, unspecified: Secondary | ICD-10-CM | POA: Diagnosis not present

## 2019-10-24 DIAGNOSIS — R0602 Shortness of breath: Secondary | ICD-10-CM | POA: Diagnosis not present

## 2019-10-24 DIAGNOSIS — J449 Chronic obstructive pulmonary disease, unspecified: Secondary | ICD-10-CM | POA: Diagnosis present

## 2019-10-24 DIAGNOSIS — F17210 Nicotine dependence, cigarettes, uncomplicated: Secondary | ICD-10-CM | POA: Diagnosis present

## 2019-10-24 DIAGNOSIS — R0902 Hypoxemia: Secondary | ICD-10-CM | POA: Diagnosis present

## 2019-10-24 DIAGNOSIS — J310 Chronic rhinitis: Secondary | ICD-10-CM | POA: Diagnosis present

## 2019-10-24 LAB — BASIC METABOLIC PANEL
Anion gap: 9 (ref 5–15)
BUN: 7 mg/dL — ABNORMAL LOW (ref 8–23)
CO2: 26 mmol/L (ref 22–32)
Calcium: 9.2 mg/dL (ref 8.9–10.3)
Chloride: 104 mmol/L (ref 98–111)
Creatinine, Ser: 0.76 mg/dL (ref 0.44–1.00)
GFR calc Af Amer: >60 mL/min (ref >60)
GFR calc non Af Amer: >60 mL/min (ref >60)
Glucose, Bld: 133 mg/dL — ABNORMAL HIGH (ref 70–99)
Potassium: 3.8 mmol/L (ref 3.5–5.1)
Sodium: 139 mmol/L (ref 135–145)

## 2019-10-24 LAB — CBC
HCT: 47.5 % — ABNORMAL HIGH (ref 36.0–46.0)
Hemoglobin: 15.2 g/dL — ABNORMAL HIGH (ref 12.0–15.0)
MCH: 30.7 pg (ref 26.0–34.0)
MCHC: 32 g/dL (ref 30.0–36.0)
MCV: 96 fL (ref 80.0–100.0)
Platelets: 448 10*3/uL — ABNORMAL HIGH (ref 150–400)
RBC: 4.95 MIL/uL (ref 3.87–5.11)
RDW: 13.2 % (ref 11.5–15.5)
WBC: 6.7 10*3/uL (ref 4.0–10.5)
nRBC: 0 % (ref 0.0–0.2)

## 2019-10-24 MED ORDER — IPRATROPIUM BROMIDE HFA 17 MCG/ACT IN AERS
4.0000 | INHALATION_SPRAY | Freq: Once | RESPIRATORY_TRACT | Status: AC
  Administered 2019-10-24: 4 via RESPIRATORY_TRACT
  Filled 2019-10-24: qty 12.9

## 2019-10-24 MED ORDER — AEROCHAMBER PLUS FLO-VU LARGE MISC: 1.0000 | Freq: Once | Status: DC

## 2019-10-24 MED ORDER — ALBUTEROL SULFATE HFA 108 (90 BASE) MCG/ACT IN AERS
8.0000 | INHALATION_SPRAY | Freq: Once | RESPIRATORY_TRACT | Status: AC
  Administered 2019-10-24: 8 via RESPIRATORY_TRACT
  Filled 2019-10-24: qty 6.7

## 2019-10-24 NOTE — Telephone Encounter (Signed)
Pt's daughter called in stating that pt's O2 is 57 and she is getting winded//SOB etc. Please call - [phone]

## 2019-10-24 NOTE — ED Notes (Signed)
Replaced oxygen tank  

## 2019-10-24 NOTE — ED Triage Notes (Signed)
Pt with hx COPD presents to ED with complaints of sob since Thursday that became worse today. Pt endorses nonproductive cough. PT denies CP

## 2019-10-24 NOTE — Telephone Encounter (Signed)
Spoke with the pt  She is c/o increased SOB over the past wk  She has noticed her sats decreased as low as 85% ra with exertion then goes right back to 90%  She does not have o2 at home  Some wheezing  No fever, chills, body aches. Had vaccines for covid.  OV with Beth tomorrow was scheduled  Advised should her symptoms persist or worse to seek emergent care sooner

## 2019-10-24 NOTE — ED Provider Notes (Signed)
MSE was initiated and I personally evaluated the patient and placed orders (if any) at  4:48 PM on October 24, 2019.  The patient appears stable so that the remainder of the MSE may be completed by another provider.  Gradual onset of SOB and chest tightness since Friday, progressively worsening. Cough productive of white sputum. Vaccinated against COVID-19, no known exposures. No f/c.  Hx of COPD. Not improved by 2 puffs of  albuterol.   BP (!) 130/107 (BP Location: Right Arm)   Pulse 80   Temp 98.4 F (36.9 C) (Oral)   Resp 18   SpO2 100%   Lungs with diffuse expiratory wheezes bilaterally, O2 sat 88% on RA, improved with 2L [place]. Albuterol and ipratroprium administered in triage.      Mayo Owczarzak, [place] N, PA-C 10/24/19 2048    Valarie Merino, MD 10/27/19 1754

## 2019-10-25 ENCOUNTER — Ambulatory Visit: Payer: Medicare Other | Admitting: Primary Care

## 2019-10-25 DIAGNOSIS — R0902 Hypoxemia: Secondary | ICD-10-CM | POA: Diagnosis not present

## 2019-10-25 DIAGNOSIS — J310 Chronic rhinitis: Secondary | ICD-10-CM | POA: Diagnosis present

## 2019-10-25 DIAGNOSIS — J441 Chronic obstructive pulmonary disease with (acute) exacerbation: Secondary | ICD-10-CM | POA: Diagnosis present

## 2019-10-25 DIAGNOSIS — J9601 Acute respiratory failure with hypoxia: Secondary | ICD-10-CM

## 2019-10-25 DIAGNOSIS — I5032 Chronic diastolic (congestive) heart failure: Secondary | ICD-10-CM | POA: Diagnosis present

## 2019-10-25 DIAGNOSIS — J449 Chronic obstructive pulmonary disease, unspecified: Secondary | ICD-10-CM | POA: Diagnosis not present

## 2019-10-25 DIAGNOSIS — Z720 Tobacco use: Secondary | ICD-10-CM

## 2019-10-25 DIAGNOSIS — I110 Hypertensive heart disease with heart failure: Secondary | ICD-10-CM | POA: Diagnosis present

## 2019-10-25 DIAGNOSIS — F17210 Nicotine dependence, cigarettes, uncomplicated: Secondary | ICD-10-CM | POA: Diagnosis present

## 2019-10-25 DIAGNOSIS — J40 Bronchitis, not specified as acute or chronic: Secondary | ICD-10-CM | POA: Diagnosis not present

## 2019-10-25 DIAGNOSIS — I10 Essential (primary) hypertension: Secondary | ICD-10-CM | POA: Diagnosis not present

## 2019-10-25 DIAGNOSIS — Z85820 Personal history of malignant melanoma of skin: Secondary | ICD-10-CM | POA: Diagnosis not present

## 2019-10-25 DIAGNOSIS — I361 Nonrheumatic tricuspid (valve) insufficiency: Secondary | ICD-10-CM | POA: Diagnosis not present

## 2019-10-25 DIAGNOSIS — R0602 Shortness of breath: Secondary | ICD-10-CM | POA: Diagnosis not present

## 2019-10-25 DIAGNOSIS — Z20822 Contact with and (suspected) exposure to covid-19: Secondary | ICD-10-CM | POA: Diagnosis present

## 2019-10-25 DIAGNOSIS — Z79899 Other long term (current) drug therapy: Secondary | ICD-10-CM | POA: Diagnosis not present

## 2019-10-25 LAB — SARS CORONAVIRUS 2 BY RT PCR (HOSPITAL ORDER, PERFORMED IN ~~LOC~~ HOSPITAL LAB): SARS Coronavirus 2: NEGATIVE

## 2019-10-25 MED ORDER — ALBUTEROL SULFATE HFA 108 (90 BASE) MCG/ACT IN AERS
6.0000 | INHALATION_SPRAY | Freq: Once | RESPIRATORY_TRACT | Status: AC
  Administered 2019-10-25: 6 via RESPIRATORY_TRACT

## 2019-10-25 MED ORDER — SODIUM CHLORIDE 0.9 % IV SOLN
1.0000 g | INTRAVENOUS | Status: DC
  Administered 2019-10-25 – 2019-10-28 (×4): 1 g via INTRAVENOUS
  Filled 2019-10-25: qty 10
  Filled 2019-10-25: qty 1
  Filled 2019-10-25 (×2): qty 10
  Filled 2019-10-25: qty 1
  Filled 2019-10-25: qty 10

## 2019-10-25 MED ORDER — IPRATROPIUM-ALBUTEROL 0.5-2.5 (3) MG/3ML IN SOLN
3.0000 mL | RESPIRATORY_TRACT | Status: DC | PRN
  Administered 2019-10-26 – 2019-10-27 (×2): 3 mL via RESPIRATORY_TRACT
  Filled 2019-10-25 (×2): qty 3

## 2019-10-25 MED ORDER — ONDANSETRON HCL 4 MG PO TABS: 4.0000 mg | ORAL_TABLET | Freq: Four times a day (QID) | ORAL | Status: DC | PRN

## 2019-10-25 MED ORDER — AMLODIPINE BESYLATE 5 MG PO TABS
2.5000 mg | ORAL_TABLET | Freq: Every day | ORAL | Status: DC
  Administered 2019-10-25 – 2019-10-28 (×4): 2.5 mg via ORAL
  Filled 2019-10-25 (×4): qty 1

## 2019-10-25 MED ORDER — LORATADINE 10 MG PO TABS
10.0000 mg | ORAL_TABLET | Freq: Every day | ORAL | Status: DC
  Administered 2019-10-25 – 2019-10-29 (×5): 10 mg via ORAL
  Filled 2019-10-25 (×5): qty 1

## 2019-10-25 MED ORDER — IPRATROPIUM-ALBUTEROL 0.5-2.5 (3) MG/3ML IN SOLN
3.0000 mL | Freq: Once | RESPIRATORY_TRACT | Status: AC
  Administered 2019-10-25: 3 mL via RESPIRATORY_TRACT
  Filled 2019-10-25: qty 3

## 2019-10-25 MED ORDER — GUAIFENESIN ER 600 MG PO TB12
600.0000 mg | ORAL_TABLET | Freq: Two times a day (BID) | ORAL | Status: DC
  Administered 2019-10-25 – 2019-10-29 (×9): 600 mg via ORAL
  Filled 2019-10-25 (×9): qty 1

## 2019-10-25 MED ORDER — ACETAMINOPHEN 325 MG PO TABS: 650.0000 mg | ORAL_TABLET | Freq: Four times a day (QID) | ORAL | Status: DC | PRN

## 2019-10-25 MED ORDER — UMECLIDINIUM-VILANTEROL 62.5-25 MCG/INH IN AEPB: 1.0000 | INHALATION_SPRAY | Freq: Every day | RESPIRATORY_TRACT | Status: DC

## 2019-10-25 MED ORDER — METHYLPREDNISOLONE SODIUM SUCC 125 MG IJ SOLR
60.0000 mg | Freq: Four times a day (QID) | INTRAMUSCULAR | Status: DC
  Administered 2019-10-25 – 2019-10-27 (×8): 60 mg via INTRAVENOUS
  Filled 2019-10-25 (×9): qty 2

## 2019-10-25 MED ORDER — SODIUM CHLORIDE 0.9 % IV BOLUS
1000.0000 mL | Freq: Once | INTRAVENOUS | Status: AC
  Administered 2019-10-25: 1000 mL via INTRAVENOUS

## 2019-10-25 MED ORDER — ONDANSETRON HCL 4 MG/2ML IJ SOLN: 4.0000 mg | Freq: Four times a day (QID) | INTRAMUSCULAR | Status: DC | PRN

## 2019-10-25 MED ORDER — MAGNESIUM SULFATE 2 GM/50ML IV SOLN
2.0000 g | Freq: Once | INTRAVENOUS | Status: AC
  Administered 2019-10-25: 2 g via INTRAVENOUS
  Filled 2019-10-25: qty 50

## 2019-10-25 MED ORDER — IPRATROPIUM-ALBUTEROL 0.5-2.5 (3) MG/3ML IN SOLN
3.0000 mL | Freq: Four times a day (QID) | RESPIRATORY_TRACT | Status: DC
  Administered 2019-10-25: 3 mL via RESPIRATORY_TRACT
  Filled 2019-10-25: qty 3

## 2019-10-25 MED ORDER — METHYLPREDNISOLONE SODIUM SUCC 125 MG IJ SOLR
125.0000 mg | Freq: Once | INTRAMUSCULAR | Status: AC
  Administered 2019-10-25: 125 mg via INTRAVENOUS
  Filled 2019-10-25: qty 2

## 2019-10-25 MED ORDER — ACETAMINOPHEN 650 MG RE SUPP: 650.0000 mg | Freq: Four times a day (QID) | RECTAL | Status: DC | PRN

## 2019-10-25 MED ORDER — ENOXAPARIN SODIUM 40 MG/0.4ML ~~LOC~~ SOLN
40.0000 mg | SUBCUTANEOUS | Status: DC
  Administered 2019-10-25 – 2019-10-29 (×5): 40 mg via SUBCUTANEOUS
  Filled 2019-10-25 (×5): qty 0.4

## 2019-10-25 MED ORDER — SODIUM CHLORIDE 0.9 % IV SOLN: INTRAVENOUS | Status: DC

## 2019-10-25 MED ORDER — IPRATROPIUM-ALBUTEROL 0.5-2.5 (3) MG/3ML IN SOLN
3.0000 mL | Freq: Three times a day (TID) | RESPIRATORY_TRACT | Status: DC
  Administered 2019-10-25 – 2019-10-26 (×2): 3 mL via RESPIRATORY_TRACT
  Filled 2019-10-25 (×2): qty 3

## 2019-10-25 NOTE — ED Notes (Signed)
Patient walked to yellow and on the way back patient O2 sat 85% on RA. Patient noted with SOB. Placed on 2L via [place] O2 sats 95% patient states she feels a lot better.

## 2019-10-25 NOTE — H&P (Addendum)
History and Physical    DOA: 10/24/2019  PCP: Jonathon Jordan, MD  Patient coming from: Home  Chief Complaint:   HPI: Heidi Ramirez is a 69 y.o. female with history h/o heart murmur, hypertension, melanoma, COPD who still smokes, presented to the ED yesterday with complaints of worsening dyspnea over the last few days.  Patient does not recollect any particular precipitating factor other than her moving an old carpet more than a week back.  Patient states she used to smoke half pack per day but has cut down to 1 pack/week now.  Never been on home O2.  She used rescue inhalers without any relief.  He also reports cough with whitish phlegm.  Denies any chest pain or dizziness or leg swellings. ED course: Hypoxic 88% on room air, required up to 5 L O2 nasal cannula initially.   BMP, CBC within normal limits.  COVID-19 negative.  Chest x-ray within normal limits.She received 1 dose of IV Solu-Medrol 125 mg earlier today in the ED and feels somewhat improved and down to 2-3 L O2 requirement. Patient is requested to be admitted for further evaluation and management. Review of Systems: As per HPI otherwise review of systems negative.    Past Medical History:  Diagnosis Date  . Heart murmur    since birth  . HTN (hypertension)   . Melanoma of trunk [facility])     Past Surgical History:  Procedure Laterality Date  . EYE SURGERY    . MELANOMA EXCISION      Social history:  reports that she has been smoking cigarettes. She started smoking about 60 years ago. She has a 14.75 pack-year smoking history. She has never used smokeless tobacco. She reports that she does not drink alcohol and does not use drugs.   No Known Allergies  Family History  Problem Relation Age of Onset  . COPD Neg Hx   . Lung cancer Neg Hx       Prior to Admission medications   Medication Sig Start Date End Date Taking? Authorizing Provider  albuterol (VENTOLIN HFA) 108 (90 Base) MCG/ACT inhaler Inhale 2 puffs into  the lungs every 6 (six) hours as needed for wheezing or shortness of breath. 08/16/19  Yes Rigoberto Noel, MD  amLODipine (NORVASC) 2.5 MG tablet Take 2.5 mg by mouth daily.   Yes [provider]  Spacer/Aero-Holding Chambers (AEROCHAMBER MV) inhaler Use as instructed Patient taking differently: 1 each by Other route as directed.  05/12/18  Yes Martyn Ehrich, NP  umeclidinium-vilanterol (ANORO ELLIPTA) 62.5-25 MCG/INH AEPB Inhale 1 puff into the lungs daily. 08/16/19  Yes Rigoberto Noel, MD    Physical Exam: Vitals:   10/25/19 0654 10/25/19 0840 10/25/19 0906 10/25/19 0929  BP:  (!) 165/70  (!) 147/67  Pulse:  79  72  Resp:  (!) 25  20  Temp:      TempSrc:      SpO2:  95% 96% 97%  Weight: 68 kg     Height: 5\' 6"  (1.676 m)       Constitutional: Thin built female, in mild respiratory distress while talking full sentences  eyes: PERRL, lids and conjunctivae normal ENMT: Mucous membranes are moist. Posterior pharynx clear of any exudate or lesions.Normal dentition.  Neck: normal, supple, no masses, no thyromegaly Respiratory: Diffuse wheezing bilaterally on auscultation, no crackles.  Increased respiratory effort with neck accessory muscle use while talking full sentences.  Cardiovascular: Regular rate and rhythm, no murmurs / rubs /  gallops. No extremity edema. 2+ pedal pulses. No carotid bruits.  Abdomen: no tenderness, no masses palpated. No hepatosplenomegaly. Bowel sounds positive.  Musculoskeletal: no clubbing / cyanosis. No joint deformity upper and lower extremities. Good ROM, no contractures. Normal muscle tone.  Neurologic: CN 2-12 grossly intact. Sensation intact, DTR normal. Strength 5/5 in all 4.  Psychiatric: Normal judgment and insight. Alert and oriented x 3. Normal mood.  SKIN/catheters: no rashes, lesions, ulcers. No induration  Labs on Admission: I have personally reviewed following labs and imaging studies  CBC: Recent Labs  Lab 10/24/19 1640  WBC 6.7    HGB 15.2*  HCT 47.5*  MCV 96.0  PLT 341*   Basic Metabolic Panel: Recent Labs  Lab 10/24/19 1640  NA 139  K 3.8  CL 104  CO2 26  GLUCOSE 133*  BUN 7*  CREATININE 0.76  CALCIUM 9.2   GFR: Estimated Creatinine Clearance: 63.9 mL/min (by C-G formula based on SCr of 0.76 mg/dL). Recent Labs  Lab 10/24/19 1640  WBC 6.7   Liver Function Tests: No results for input(s): AST, ALT, ALKPHOS, BILITOT, PROT, ALBUMIN in the last 168 hours. No results for input(s): LIPASE, AMYLASE in the last 168 hours. No results for input(s): AMMONIA in the last 168 hours. Coagulation Profile: No results for input(s): INR, PROTIME in the last 168 hours. Cardiac Enzymes: No results for input(s): CKTOTAL, CKMB, CKMBINDEX, TROPONINI in the last 168 hours. BNP (last 3 results) No results for input(s): PROBNP in the last 8760 hours. HbA1C: No results for input(s): HGBA1C in the last 72 hours. CBG: No results for input(s): GLUCAP in the last 168 hours. Lipid Profile: No results for input(s): CHOL, HDL, LDLCALC, TRIG, CHOLHDL, LDLDIRECT in the last 72 hours. Thyroid Function Tests: No results for input(s): TSH, T4TOTAL, FREET4, T3FREE, THYROIDAB in the last 72 hours. Anemia Panel: No results for input(s): VITAMINB12, FOLATE, FERRITIN, TIBC, IRON, RETICCTPCT in the last 72 hours. Urine analysis: No results found for: COLORURINE, APPEARANCEUR, LABSPEC, PHURINE, GLUCOSEU, HGBUR, BILIRUBINUR, KETONESUR, PROTEINUR, UROBILINOGEN, NITRITE, LEUKOCYTESUR  Radiological Exams on Admission: Personally reviewed  DG Chest 2 View  Result Date: 10/24/2019 CLINICAL DATA:  69 year old female with shortness of breath. EXAM: CHEST - 2 VIEW COMPARISON:  Chest radiograph dated 01/21/2019. FINDINGS: No focal consolidation, pleural effusion, or pneumothorax. There is background of emphysema. The cardiac silhouette is within limits. Atherosclerotic calcification of the aorta. Degenerative changes of the spine. No acute  osseous pathology. IMPRESSION: No active cardiopulmonary disease. Electronically Signed   By: Anner Crete M.D.   On: 10/24/2019 18:06    EKG: Independently reviewed.  Normal sinus rhythm with incomplete bundle branch block     Assessment and Plan:   Active Problems:   Hypoxia   HTN (hypertension)   Smoking   Chronic rhinitis   COPD exacerbation ([place])    1.  Acute hypoxic respiratory failure: Secondary to COPD exacerbation.  Currently requiring 2-3 L O2 via nasal cannula.  COVID-19 ruled out.  Reports full vaccination with Pfizer vaccine series in March 2021.  Will admit with IV steroids.  Taper O2 as tolerated.  May need home O2 evaluation prior to discharge.  2.  COPD exacerbation: Likely precipitated by ongoing smoking and recently handling old carpets.  Patient received Solu-Medrol 125 mg IV in the ED and feels somewhat better with reduced O2 requirement.  Will admit with antitussives, antihistamines and IV Solu-Medrol 60 mg IV every 6, taper as tolerated.  Add empiric antibiotics given productive cough.  3.  Tobacco use: Counseled to quit.  She verbalized understanding and stated she is in the process and has reduced to 1 pack/week.  She declined nicotine patch while here.  4.  Hypertension: Currently elevated in the setting of respiratory distress and IV steroids.  Resume home medications and monitor.  DVT prophylaxis: Lovenox  COVID screen: Negative  Code Status: Full code.Health care proxy would be daughter  Patient/Family Communication: Discussed with patient and daughter at bedside, all questions answered to satisfaction.  Consults called: None Admission status :I certify that at the point of admission it is my clinical judgment that the patient will require inpatient hospital care spanning beyond 2 midnights from the point of admission due to high intensity of service and high frequency of surveillance required.Inpatient status is judged to be reasonable and  necessary in order to provide the required intensity of service to ensure the patient's safety. The patient's presenting symptoms, physical exam findings, and initial radiographic and laboratory data in the context of their chronic comorbidities is felt to place them at high risk for further clinical deterioration. The following factors support the patient status of inpatient :      Guilford Shi MD [facility] Pager in [place]  If 7PM-7AM, please contact night-coverage [email or web address]   10/25/2019, 11:53 AM

## 2019-10-25 NOTE — ED Provider Notes (Signed)
[facility] Provider Note   CSN: [id number] Arrival date & time: 10/24/19  1623     History Chief Complaint  Patient presents with  . Shortness of Breath    Heidi Ramirez is a 69 y.o. female with history of heart murmur, hypertension, melanoma, COPD presents for evaluation of acute onset, progressively worsening shortness of breath for 5 days.  Symptoms began around Thursday.  She reports that her chronic cough which is typically only in the mornings is now more persistent and that she is coughing up clear-white sputum throughout the day.  She denies fevers or chills.  She reports progressively worsening chest tightness but denies chest pain.  She has been using her albuterol inhaler over the last 4 days which she has not needed to use for several months.  Since yesterday she has been using the albuterol inhaler at least every hour with little relief.  She is a current smoker, but is trying to cut back.  Initially seen at triage and screened, noted to be hypoxic to 88% on room air and placed on supplemental oxygen via nasal cannula.  She was initially on 2 L but it was increased to 5 L but in the room was able to be weaned down to 3 L/min.  She also received albuterol and ipratropium at in triage with some improvement.  Denies abdominal pain, nausea, vomiting.  Daughter reports that she looks very similar to when she required admission for a COPD exacerbation in December 2019.  The history is provided by the patient.       Past Medical History:  Diagnosis Date  . Heart murmur    since birth  . HTN (hypertension)   . Melanoma of trunk [facility])     Patient Active Problem List   Diagnosis Date Noted  . COPD exacerbation ([place]) 10/25/2019  . Chronic rhinitis 04/11/2019  . COPD (chronic obstructive pulmonary disease) ([place]) 03/25/2018  . Hypoxia 03/25/2018  . HTN (hypertension) 03/25/2018  . Smoking 03/25/2018    Past Surgical History:  Procedure  Laterality Date  . EYE SURGERY    . MELANOMA EXCISION       OB History   No obstetric history on file.     Family History  Problem Relation Age of Onset  . COPD Neg Hx   . Lung cancer Neg Hx     Social History   Tobacco Use  . Smoking status: Current Every Day Smoker    Packs/day: 0.25    Years: 59.00    Pack years: 14.75    Types: Cigarettes    Start date: 36  . Smokeless tobacco: Never Used  Substance Use Topics  . Alcohol use: Never  . Drug use: Never    Home Medications Prior to Admission medications   Medication Sig Start Date End Date Taking? Authorizing Provider  albuterol (VENTOLIN HFA) 108 (90 Base) MCG/ACT inhaler Inhale 2 puffs into the lungs every 6 (six) hours as needed for wheezing or shortness of breath. 08/16/19  Yes Rigoberto Noel, MD  amLODipine (NORVASC) 2.5 MG tablet Take 2.5 mg by mouth daily.   Yes [provider]  Spacer/Aero-Holding Chambers (AEROCHAMBER MV) inhaler Use as instructed Patient taking differently: 1 each by Other route as directed.  05/12/18  Yes Martyn Ehrich, NP  umeclidinium-vilanterol (ANORO ELLIPTA) 62.5-25 MCG/INH AEPB Inhale 1 puff into the lungs daily. 08/16/19  Yes Rigoberto Noel, MD    Allergies    Patient  has no known allergies.  Review of Systems   Review of Systems  Constitutional: Negative for chills and fever.  Respiratory: Positive for cough, chest tightness and shortness of breath.   Cardiovascular: Negative for chest pain and leg swelling.  Gastrointestinal: Negative for abdominal pain, nausea and vomiting.  All other systems reviewed and are negative.   Physical Exam Updated Vital Signs BP (!) 147/67   Pulse 72   Temp 97.9 F (36.6 C) (Oral)   Resp 20   Ht 5\' 6"  (1.676 m)   Wt 68 kg   SpO2 97%   BMI 24.21 kg/m   Physical Exam Vitals and nursing note reviewed.  Constitutional:      Appearance: She is well-developed.  HENT:     Head: Normocephalic and atraumatic.  Eyes:      General:        Right eye: No discharge.        Left eye: No discharge.     Conjunctiva/sclera: Conjunctivae normal.  Neck:     Vascular: No JVD.     Trachea: No tracheal deviation.  Cardiovascular:     Rate and Rhythm: Normal rate and regular rhythm.  Pulmonary:     Effort: Tachypnea present.     Breath sounds: Wheezing present.     Comments: Diffuse expiratory wheezes.  Speaking in short phrases.  SPO2 saturations 93% on 3 L supplemental oxygen via nasal cannula Abdominal:     General: There is no distension.  Musculoskeletal:     Right lower leg: No tenderness. No edema.     Left lower leg: No tenderness. No edema.  Skin:    General: Skin is warm.     Findings: No erythema.  Neurological:     Mental Status: She is alert.  Psychiatric:        Behavior: Behavior normal.     ED Results / Procedures / Treatments   Labs (all labs ordered are listed, but only abnormal results are displayed) Labs Reviewed  BASIC METABOLIC PANEL - Abnormal; Notable for the following components:      Result Value   Glucose, Bld 133 (*)    BUN 7 (*)    All other components within normal limits  CBC - Abnormal; Notable for the following components:   Hemoglobin 15.2 (*)    HCT 47.5 (*)    Platelets 448 (*)    All other components within normal limits  SARS CORONAVIRUS 2 BY RT PCR (HOSPITAL ORDER, [facility])  HIV ANTIBODY (ROUTINE TESTING W REFLEX)  CBC  CREATININE, SERUM    EKG EKG Interpretation  Date/Time:  Monday October 24 2019 16:34:16 EDT Ventricular Rate:  90 PR Interval:  160 QRS Duration: 98 QT Interval:  380 QTC Calculation: 464 R Axis:   72 Text Interpretation: Normal sinus rhythm Biatrial enlargement Incomplete right bundle branch block Septal infarct , age undetermined Non-specific abnormality, ST segment, and/or T-wave Confirmed by Randal Buba, April ([id number]) on 10/25/2019 8:56:37 AM   Radiology DG Chest 2 View  Result Date: 10/24/2019 CLINICAL  DATA:  69 year old female with shortness of breath. EXAM: CHEST - 2 VIEW COMPARISON:  Chest radiograph dated 01/21/2019. FINDINGS: No focal consolidation, pleural effusion, or pneumothorax. There is background of emphysema. The cardiac silhouette is within limits. Atherosclerotic calcification of the aorta. Degenerative changes of the spine. No acute osseous pathology. IMPRESSION: No active cardiopulmonary disease. Electronically Signed   By: Anner Crete M.D.   On: 10/24/2019 18:06  Procedures .Critical Care Performed by: Renita Papa, PA-C Authorized by: Renita Papa, PA-C   Critical care provider statement:    Critical care time (minutes):  45   Critical care was necessary to treat or prevent imminent or life-threatening deterioration of the following conditions:  Respiratory failure   Critical care was time spent personally by me on the following activities:  Discussions with consultants, evaluation of patient's response to treatment, examination of patient, ordering and performing treatments and interventions, ordering and review of laboratory studies, ordering and review of radiographic studies, pulse oximetry, re-evaluation of patient's condition, obtaining history from patient or surrogate and review of old charts   (including critical care time)  Medications Ordered in ED Medications  amLODipine (NORVASC) tablet 2.5 mg (has no administration in time range)  enoxaparin (LOVENOX) injection 40 mg (has no administration in time range)  methylPREDNISolone sodium succinate (SOLU-MEDROL) 125 mg/2 mL injection 60 mg (has no administration in time range)  ipratropium-albuterol (DUONEB) 0.5-2.5 (3) MG/3ML nebulizer solution 3 mL (has no administration in time range)  ipratropium-albuterol (DUONEB) 0.5-2.5 (3) MG/3ML nebulizer solution 3 mL (has no administration in time range)  acetaminophen (TYLENOL) tablet 650 mg (has no administration in time range)    Or  acetaminophen (TYLENOL)  suppository 650 mg (has no administration in time range)  ondansetron (ZOFRAN) tablet 4 mg (has no administration in time range)    Or  ondansetron (ZOFRAN) injection 4 mg (has no administration in time range)  guaiFENesin (MUCINEX) 12 hr tablet 600 mg (has no administration in time range)  sodium chloride 0.9 % bolus 1,000 mL (0 mLs Intravenous Stopped 10/25/19 0821)    And  0.9 %  sodium chloride infusion (has no administration in time range)  albuterol (VENTOLIN HFA) 108 (90 Base) MCG/ACT inhaler 8 puff (8 puffs Inhalation Given 10/24/19 1656)  ipratropium (ATROVENT HFA) inhaler 4 puff (4 puffs Inhalation Given 10/24/19 1656)  methylPREDNISolone sodium succinate (SOLU-MEDROL) 125 mg/2 mL injection 125 mg (125 mg Intravenous Given 10/25/19 0644)  albuterol (VENTOLIN HFA) 108 (90 Base) MCG/ACT inhaler 6 puff (6 puffs Inhalation Given 10/25/19 0645)  magnesium sulfate IVPB 2 g 50 mL (0 g Intravenous Stopped 10/25/19 0820)  ipratropium-albuterol (DUONEB) 0.5-2.5 (3) MG/3ML nebulizer solution 3 mL (3 mLs Nebulization Given 10/25/19 8676)    ED Course  I have reviewed the triage vital signs and the nursing notes.  Pertinent labs & imaging results that were available during my care of the patient were reviewed by me and considered in my medical decision making (see chart for details).    MDM Rules/Calculators/A&P                          Heidi Ramirez was evaluated in Emergency Department on 10/25/2019 for the symptoms described in the history of present illness. She was evaluated in the context of the global COVID-19 pandemic, which necessitated consideration that the patient might be at risk for infection with the SARS-CoV-2 virus that causes COVID-19. Institutional protocols and algorithms that pertain to the evaluation of patients at risk for COVID-19 are in a state of rapid change based on information released by regulatory bodies including the CDC and federal and state organizations. These  policies and algorithms were followed during the patient's care in the ED.  Patient resenting for evaluation of progressively worsening shortness of breath, wheezing, chest tightness.  She is afebrile in the ED, hypoxic to 88% on room air at  rest.  She improved on supplemental oxygen.  At one point was increased from 2 L to 5 L/min but was able to be weaned back down.  On my initial assessment she exhibits diffuse wheezing on auscultation of the lungs, speaking very short phrases, leaning forward.  Tolerating secretions without difficulty.  She had modest improvement with IV fluids, Solu-Medrol, magnesium, and 6 puffs of albuterol via inhaler.  Unable to give breathing treatments initially in the setting of the COVID-19 pandemic but her Covid test was negative so she subsequently underwent DuoNeb treatment.  She has had some improvement in her respiratory status but when ambulated dropped to 85% on room air and appeared markedly short of breath.  Lab work reviewed and interpreted by myself shows no leukocytosis, elevated hemoglobin and hematocrit could be in the setting of hemoconcentration due to dehydration or in the setting of her tobacco use.  No renal insufficiency or metabolic derangements.  Chest x-ray shows no edema or consolidation, no pneumothorax or pleural effusion.  Doubt PE at this time.  EKG shows normal sinus rhythm, low suspicion for ACS/MI as the patient denies any chest pain, more so chest tightness associated with her wheezing.  Suspect likely COPD exacerbation.  Spoke with Dr. Earnest Conroy with [facility] who agrees to assume care of patient and bring her to the hospital for further evaluation and management.  Final Clinical Impression(s) / ED Diagnoses Final diagnoses:  Acute respiratory failure with hypoxia ([place])  COPD exacerbation [facility])    Rx / DC Orders ED Discharge Orders    None       Debroah Baller 10/25/19 1029    Horton, Barbette Hair, MD 10/26/19  [phone]

## 2019-10-25 NOTE — ED Notes (Signed)
Lunch Tray Ordered @ 1027. °

## 2019-10-26 DIAGNOSIS — R0902 Hypoxemia: Secondary | ICD-10-CM | POA: Diagnosis not present

## 2019-10-26 DIAGNOSIS — J441 Chronic obstructive pulmonary disease with (acute) exacerbation: Secondary | ICD-10-CM | POA: Diagnosis not present

## 2019-10-26 LAB — BASIC METABOLIC PANEL
Anion gap: 10 (ref 5–15)
BUN: 11 mg/dL (ref 8–23)
CO2: 24 mmol/L (ref 22–32)
Calcium: 8.8 mg/dL — ABNORMAL LOW (ref 8.9–10.3)
Chloride: 106 mmol/L (ref 98–111)
Creatinine, Ser: 0.62 mg/dL (ref 0.44–1.00)
GFR calc Af Amer: >60 mL/min (ref >60)
GFR calc non Af Amer: >60 mL/min (ref >60)
Glucose, Bld: 133 mg/dL — ABNORMAL HIGH (ref 70–99)
Potassium: 4 mmol/L (ref 3.5–5.1)
Sodium: 140 mmol/L (ref 135–145)

## 2019-10-26 LAB — HIV ANTIBODY (ROUTINE TESTING W REFLEX): HIV Screen 4th Generation wRfx: NONREACTIVE

## 2019-10-26 MED ORDER — UMECLIDINIUM-VILANTEROL 62.5-25 MCG/INH IN AEPB
1.0000 | INHALATION_SPRAY | Freq: Every day | RESPIRATORY_TRACT | Status: DC
  Administered 2019-10-27 – 2019-10-29 (×3): 1 via RESPIRATORY_TRACT
  Filled 2019-10-26: qty 14

## 2019-10-26 NOTE — Progress Notes (Signed)
PROGRESS NOTE  Heidi Ramirez  DOB: 01/04/1951  PCP: Jonathon Jordan, MD WJX:[id number]  DOA: 10/24/2019  LOS: 1 day   Chief Complaint  Patient presents with  . Shortness of Breath   Brief narrative: Heidi Ramirez is a 69 y.o. female with PMH of chronic smoking, COPD, not on home O2, hypertension, melanoma. Patient presented to the ED on 10/24/2019  with complaints of worsening dyspnea for few days that started after moving an old carpet 2 days prior to symptoms onset.  Patient continues to smoke 3 to 4 cigarettes/day. Never been on home O2.  She used rescue inhalers without any relief.   In the ED, she was hypoxic at 88%, initially required up to 5 L oxygen by nasal cannula. CBC/BMP unremarkable. COVID-19 negative.   Chest x-ray did not show any acute pulmonary process. Patient was admitted for COPD exacerbation.  Subjective: Patient was seen and examined this afternoon. Sitting up at the edge of the bed.  On 2 L oxygen by nasal cannula.  States that this pain gets worse even on ambulation to the bathroom. On examination continues to have bilateral wheezing and coughing spells on deep breathing.  Assessment/Plan:  Acute hypoxic respiratory failure COPD exacerbation -No infiltrates on chest x-ray. -Does not use oxygen at home.  Initially required up to 5 L oxygen by nasal cannula.  Currently on 2 to 3 L. -COVID-19 ruled out.  -Currently on Solu-Medrol 60 mg every 6 hours along with IV Rocephin.  I will continue the same. -Resume Anoro.  DuoNeb as needed. -Continue Mucinex, incentive spirometry, flutter valve. -Reports full vaccination with Pfizer vaccine series in March 2021.    Tobacco use: Counseled to quit.  She verbalized understanding and stated she is in the process and has reduced to 1 pack/week.  She declined nicotine patch while here.  Hypertension:  -Currently elevated in the setting of respiratory distress and IV steroids.  -Continue to monitor on amlodipine  2.5 mg daily from home.    Mobility: Encourage ambulation. Code Status:   Code Status: Full Code  Nutritional status: Body mass index is 24.21 kg/m.     Diet Order            Diet Heart Room service appropriate? Yes; Fluid consistency: Thin  Diet effective now                 DVT prophylaxis: enoxaparin (LOVENOX) injection 40 mg Start: 10/25/19 0945   Antimicrobials:  IV Rocephin Fluid: none  Consultants: None Family Communication:  Granddaughter at bedside  Status is: Inpatient  Remains inpatient appropriate because:Ongoing diagnostic testing needed not appropriate for outpatient work up and IV treatments appropriate due to intensity of illness or inability to take PO   Dispo: The patient is from: Home              Anticipated d/c is to: SNF              Anticipated d/c date is: 2 days              Patient currently is not medically stable to d/c.       Infusions:  . cefTRIAXone (ROCEPHIN)  IV 1 g (10/26/19 1146)    Scheduled Meds: . amLODipine  2.5 mg Oral Daily  . enoxaparin (LOVENOX) injection  40 mg Subcutaneous Q24H  . guaiFENesin  600 mg Oral BID  . loratadine  10 mg Oral Daily  . methylPREDNISolone (SOLU-MEDROL) injection  60 mg Intravenous  Q6H    Antimicrobials: Anti-infectives (From admission, onward)   Start     Dose/Rate Route Frequency Ordered Stop   10/25/19 1200  cefTRIAXone (ROCEPHIN) 1 g in sodium chloride 0.9 % 100 mL IVPB     Discontinue     1 g 200 mL/hr over 30 Minutes Intravenous Every 24 hours 10/25/19 1151        PRN meds: acetaminophen **OR** acetaminophen, ipratropium-albuterol, ondansetron **OR** ondansetron (ZOFRAN) IV   Objective: Vitals:   10/26/19 0736 10/26/19 0823  BP: (!) 143/61   Pulse: 81   Resp: 18   Temp: 98 F (36.7 C)   SpO2: 95% 93%    Intake/Output Summary (Last 24 hours) at 10/26/2019 1329 Last data filed at 10/25/2019 2125 Gross per 24 hour  Intake 240 ml  Output --  Net 240 ml   Filed Weights    10/25/19 0654  Weight: 68 kg   Weight change:  Body mass index is 24.21 kg/m.   Physical Exam: General exam: Appears calm and comfortable.  Not in physical distress at rest Skin: No rashes, lesions or ulcers. HEENT: Atraumatic, normocephalic, supple neck, no obvious bleeding Lungs: Bilateral scattered wheezing, cough and deep breathing CVS: Regular rate and rhythm, no murmur GI/Abd soft, nontender, nondistended, bowel sounds present CNS: Alert, awake, oriented x3 Psychiatry: Mood appropriate Extremities: No pedal edema, no calf tenderness  Data Review: I have personally reviewed the laboratory data and studies available.  Recent Labs  Lab 10/24/19 1640  WBC 6.7  HGB 15.2*  HCT 47.5*  MCV 96.0  PLT 448*   Recent Labs  Lab 10/24/19 1640 10/26/19 0503  NA 139 140  K 3.8 4.0  CL 104 106  CO2 26 24  GLUCOSE 133* 133*  BUN 7* 11  CREATININE 0.76 0.62  CALCIUM 9.2 8.8*   No results found for: HGBA1C  No results found for: CHOL, TRIG, HDL, CHOLHDL, VLDL, LDLCALC, LDLDIRECT, LABVLDL  Signed, Terrilee Croak, MD [facility] Pager: [phone] (Secure Chat preferred). 10/26/2019

## 2019-10-26 NOTE — Progress Notes (Signed)
SATURATION QUALIFICATIONS: (This note is used to comply with regulatory documentation for home oxygen)  Patient Saturations on Room Air at Rest =92%  Patient Saturations on Room Air while Ambulating = 83%  Patient Saturations on 2 Liters of oxygen while Ambulating = 90%

## 2019-10-27 DIAGNOSIS — R0902 Hypoxemia: Secondary | ICD-10-CM | POA: Diagnosis not present

## 2019-10-27 DIAGNOSIS — J441 Chronic obstructive pulmonary disease with (acute) exacerbation: Secondary | ICD-10-CM | POA: Diagnosis not present

## 2019-10-27 MED ORDER — METHYLPREDNISOLONE SODIUM SUCC 125 MG IJ SOLR
60.0000 mg | Freq: Two times a day (BID) | INTRAMUSCULAR | Status: DC
  Administered 2019-10-27 – 2019-10-29 (×4): 60 mg via INTRAVENOUS
  Filled 2019-10-27 (×4): qty 2

## 2019-10-27 NOTE — Progress Notes (Signed)
PROGRESS NOTE  Heidi Ramirez  DOB: 1950-09-05  PCP: Jonathon Jordan, MD EQA:[id number]  DOA: 10/24/2019  LOS: 2 days   Chief Complaint  Patient presents with  . Shortness of Breath   Brief narrative: Heidi Ramirez is a 69 y.o. female with PMH of chronic smoking, COPD, not on home O2, hypertension, melanoma. Patient presented to the ED on 10/24/2019  with complaints of worsening dyspnea for few days that started after moving an old carpet 2 days prior to symptoms onset.  Patient continues to smoke 3 to 4 cigarettes/day. Never been on home O2.  She used rescue inhalers without any relief.   In the ED, she was hypoxic at 88%, initially required up to 5 L oxygen by nasal cannula. CBC/BMP unremarkable. COVID-19 negative.   Chest x-ray did not show any acute pulmonary process. Patient was admitted for COPD exacerbation.  Subjective: Patient was seen and examined this morning. Sitting up in bed.  Not on supplemental oxygen at rest.  Feels better than yesterday. She is currently on heavy dose of IV steroids 60 mg every 6 hours. Only minimal wheezing on auscultation.  Assessment/Plan:  Acute hypoxic respiratory failure COPD exacerbation -No infiltrates on chest x-ray. -Does not use oxygen at home.  Initially required up to 5 L oxygen by nasal cannula.  Currently not on supplemental oxygen at rest and on 2 to 3 L on ambulation. -COVID-19 ruled out.  -Currently on Solu-Medrol 60 mg every 6 hours along with IV Rocephin.  Will cut down Solu-Medrol to 60 mg every 12 hours.  Continue IV Rocephin. -Continue Anoro.  DuoNeb as needed. -Continue Mucinex, incentive spirometry, flutter valve. -Reports full vaccination with Pfizer vaccine series in March 2021.    Tobacco use: Counseled to quit.  She verbalized understanding and stated she is in the process and has reduced to 1 pack/week.  She declined nicotine patch while here.  Hypertension:  -Currently elevated in the setting of  respiratory distress and IV steroids.  -Continue to monitor on amlodipine 2.5 mg daily from home.    Mobility: Encourage ambulation. Code Status:   Code Status: Full Code  Nutritional status: Body mass index is 24.21 kg/m.     Diet Order            Diet Heart Room service appropriate? Yes; Fluid consistency: Thin  Diet effective now                 DVT prophylaxis: enoxaparin (LOVENOX) injection 40 mg Start: 10/25/19 0945   Antimicrobials:  IV Rocephin Fluid: none  Consultants: None Family Communication:  Granddaughter at bedside  Status is: Inpatient  Remains inpatient appropriate because:Ongoing diagnostic testing needed not appropriate for outpatient work up and IV treatments appropriate due to intensity of illness or inability to take PO   Dispo: The patient is from: Home              Anticipated d/c is to: Home              Anticipated d/c date is: Hopefully home tomorrow              Patient currently is not medically stable to d/c.    Infusions:  . cefTRIAXone (ROCEPHIN)  IV 1 g (10/27/19 1000)    Scheduled Meds: . amLODipine  2.5 mg Oral Daily  . enoxaparin (LOVENOX) injection  40 mg Subcutaneous Q24H  . guaiFENesin  600 mg Oral BID  . loratadine  10 mg Oral Daily  .  methylPREDNISolone (SOLU-MEDROL) injection  60 mg Intravenous Q6H  . umeclidinium-vilanterol  1 puff Inhalation Daily    Antimicrobials: Anti-infectives (From admission, onward)   Start     Dose/Rate Route Frequency Ordered Stop   10/25/19 1200  cefTRIAXone (ROCEPHIN) 1 g in sodium chloride 0.9 % 100 mL IVPB     Discontinue     1 g 200 mL/hr over 30 Minutes Intravenous Every 24 hours 10/25/19 1151        PRN meds: acetaminophen **OR** acetaminophen, ipratropium-albuterol, ondansetron **OR** ondansetron (ZOFRAN) IV   Objective: Vitals:   10/27/19 0742 10/27/19 0757  BP: (!) 159/62   Pulse: 89   Resp:    Temp: 98.5 F (36.9 C)   SpO2: 91% 93%    Intake/Output Summary (Last  24 hours) at 10/27/2019 1147 Last data filed at 10/26/2019 2055 Gross per 24 hour  Intake 441.13 ml  Output --  Net 441.13 ml   Filed Weights   10/25/19 0654  Weight: 68 kg   Weight change:  Body mass index is 24.21 kg/m.   Physical Exam: General exam: Appears calm and comfortable.  Not in physical distress at rest Skin: No rashes, lesions or ulcers. HEENT: Atraumatic, normocephalic, supple neck, no obvious bleeding Lungs: Only minimal scattered wheezing.  Otherwise clear to auscultation bilaterally CVS: Regular rate and rhythm, no murmur GI/Abd soft, nontender, nondistended, bowel sounds present CNS: Alert, awake, oriented x3 Psychiatry: In good mood today Extremities: No pedal edema, no calf tenderness  Data Review: I have personally reviewed the laboratory data and studies available.  Recent Labs  Lab 10/24/19 1640  WBC 6.7  HGB 15.2*  HCT 47.5*  MCV 96.0  PLT 448*   Recent Labs  Lab 10/24/19 1640 10/26/19 0503  NA 139 140  K 3.8 4.0  CL 104 106  CO2 26 24  GLUCOSE 133* 133*  BUN 7* 11  CREATININE 0.76 0.62  CALCIUM 9.2 8.8*   No results found for: HGBA1C  No results found for: CHOL, TRIG, HDL, CHOLHDL, VLDL, LDLCALC, LDLDIRECT, LABVLDL  Signed, Terrilee Croak, MD [facility] Pager: [phone] (Secure Chat preferred). 10/27/2019

## 2019-10-27 NOTE — TOC Initial Note (Signed)
Transition of Care [facility]) - Initial/Assessment Note    Patient Details  Name: Heidi Ramirez MRN: [id number] Date of Birth: 10-06-1950  Transition of Care [facility]) CM/SW Contact:    Verdell Carmine, RN Phone Number: 10/27/2019, 9:00 AM  Clinical Narrative:                 Patient admitted with COPD  Oxygen qualification done yesterday room air ambulation saturations 83% will need home O2. Will give patient choice of companies and explain home O2 to patent.   Expected Discharge Plan: [place] Barriers to Discharge: Continued Medical Work up   Patient Goals and CMS Choice     Choice offered to / list presented to : Patient  Expected Discharge Plan and Services Expected Discharge Plan: [place]                         DME Arranged: Oxygen                    Prior Living Arrangements/Services              Need for Family Participation in Patient Care: Yes (Comment) Care giver support system in place?: Yes (comment)      Activities of Daily Living      Permission Sought/Granted                  Emotional Assessment Appearance:: Appears older than stated age     Orientation: : Oriented to Self, Oriented to Place, Oriented to  Time, Oriented to Situation Alcohol / Substance Use: Tobacco Use Psych Involvement: No (comment)  Admission diagnosis:  COPD exacerbation ([place]) [J44.1] Acute respiratory failure with hypoxia ([place]) [J96.01] Patient Active Problem List   Diagnosis Date Noted  . COPD exacerbation ([place]) 10/25/2019  . Chronic rhinitis 04/11/2019  . COPD (chronic obstructive pulmonary disease) ([place]) 03/25/2018  . Hypoxia 03/25/2018  . HTN (hypertension) 03/25/2018  . Smoking 03/25/2018   PCP:  Jonathon Jordan, MD Pharmacy:   [id number] - [place], [place]. AT [address] [place] [address] Phone: [phone] Fax:  [phone]     Social Determinants of Health (SDOH) Interventions    Readmission Risk Interventions No flowsheet data found.

## 2019-10-27 NOTE — Plan of Care (Signed)

## 2019-10-28 ENCOUNTER — Encounter (HOSPITAL_COMMUNITY): Payer: Self-pay | Admitting: Internal Medicine

## 2019-10-28 ENCOUNTER — Inpatient Hospital Stay (HOSPITAL_COMMUNITY): Payer: Medicare Other

## 2019-10-28 DIAGNOSIS — I361 Nonrheumatic tricuspid (valve) insufficiency: Secondary | ICD-10-CM | POA: Diagnosis not present

## 2019-10-28 DIAGNOSIS — J441 Chronic obstructive pulmonary disease with (acute) exacerbation: Secondary | ICD-10-CM | POA: Diagnosis not present

## 2019-10-28 LAB — ECHOCARDIOGRAM COMPLETE
Area-P 1/2: 4.39 cm2
Height: 66 in
S' Lateral: 2.8 cm
Weight: 2400 oz

## 2019-10-28 MED ORDER — AMLODIPINE BESYLATE 5 MG PO TABS
5.0000 mg | ORAL_TABLET | Freq: Every day | ORAL | Status: DC
  Administered 2019-10-29: 5 mg via ORAL
  Filled 2019-10-28: qty 1

## 2019-10-28 NOTE — Progress Notes (Signed)
  Echocardiogram 2D Echocardiogram has been performed.  Darlina Sicilian M 10/28/2019, 1:37 PM

## 2019-10-28 NOTE — Progress Notes (Signed)
PROGRESS NOTE  [name]  DOB: [birth year]  PCP: Jonathon Jordan, MD QIW:[id number]  DOA: 10/24/2019  LOS: 3 days   Chief Complaint  Patient presents with  . Shortness of Breath   Brief narrative: Heidi Ramirez is a 69 y.o. female with PMH of chronic smoking, COPD, not on home O2, hypertension, melanoma. Patient presented to the ED on 10/24/2019  with complaints of worsening dyspnea for few days that started after moving an old carpet 2 days prior to symptoms onset.  Patient continues to smoke 3 to 4 cigarettes/day. Never been on home O2.  She used rescue inhalers without any relief.   In the ED, she was hypoxic at 88%, initially required up to 5 L oxygen by nasal cannula. CBC/BMP unremarkable. COVID-19 negative.   Chest x-ray did not show any acute pulmonary process. Patient was admitted for COPD exacerbation.  Subjective: Patient was seen and examined this morning. Sitting up in bed.  She was just back from bathroom.  I noticed that she gets easily winded even on ambulation to the bathroom.  Has bilateral wheezing on auscultation. Patient does not feel comfortable going home today.  I agree she needs more IV steroids.  Assessment/Plan:  Acute hypoxic respiratory failure COPD exacerbation -No infiltrates on chest x-ray. -Does not use oxygen at home.  Initially required up to 5 L oxygen by nasal cannula.  Currently not on supplemental oxygen at rest and on 2 to 3 L on ambulation. -COVID-19 ruled out.  -Currently on Solu-Medrol, being weaned down, 60 mg every 12 hours continue same for now.  Continue IV Rocephin.  -Continue Anoro.  DuoNeb as needed. -Continue Mucinex, incentive spirometry, flutter valve. -Reports full vaccination with Pfizer vaccine series in March 2021.    Tobacco use: Counseled to quit.  She verbalized understanding and stated she is in the process and has reduced to 1 pack/week.  She declined nicotine patch while here.  Hypertension:  -Currently  elevated in the setting of respiratory distress and IV steroids.  -Uses amlodipine 2.5 mg daily at home.  Increase to 5 mg daily from tomorrow -Echocardiogram ordered.  Mobility: Encourage ambulation. Code Status:   Code Status: Full Code  Nutritional status: Body mass index is 24.21 kg/m.     Diet Order            Diet Heart Room service appropriate? Yes; Fluid consistency: Thin  Diet effective now                 DVT prophylaxis: enoxaparin (LOVENOX) injection 40 mg Start: 10/25/19 0945   Antimicrobials:  IV Rocephin Fluid: none  Consultants: None Family Communication:  Daughter at bedside.  Status is: Inpatient  Remains inpatient appropriate because:IV treatments appropriate due to intensity of illness or inability to take PO   Dispo: The patient is from: Home              Anticipated d/c is to: Home with supplemental oxygen              Anticipated d/c date is: 1 day              Patient currently is not medically stable to d/c.  Still wheezing.  Monitor on 1 more day of IV steroids.         Infusions:  . cefTRIAXone (ROCEPHIN)  IV 1 g (10/28/19 1303)    Scheduled Meds: . amLODipine  2.5 mg Oral Daily  . enoxaparin (LOVENOX) injection  40 mg  Subcutaneous Q24H  . guaiFENesin  600 mg Oral BID  . loratadine  10 mg Oral Daily  . methylPREDNISolone (SOLU-MEDROL) injection  60 mg Intravenous Q12H  . umeclidinium-vilanterol  1 puff Inhalation Daily    Antimicrobials: Anti-infectives (From admission, onward)   Start     Dose/Rate Route Frequency Ordered Stop   10/25/19 1200  cefTRIAXone (ROCEPHIN) 1 g in sodium chloride 0.9 % 100 mL IVPB     Discontinue     1 g 200 mL/hr over 30 Minutes Intravenous Every 24 hours 10/25/19 1151        PRN meds: acetaminophen **OR** acetaminophen, ipratropium-albuterol, ondansetron **OR** ondansetron (ZOFRAN) IV   Objective: Vitals:   10/28/19 0804 10/28/19 0910  BP: (!) 166/75   Pulse: 78   Resp: 18   Temp: 97.8  F (36.6 C)   SpO2: 96% 95%    Intake/Output Summary (Last 24 hours) at 10/28/2019 1316 Last data filed at 10/27/2019 2140 Gross per 24 hour  Intake 240 ml  Output --  Net 240 ml   Filed Weights   10/25/19 0654  Weight: 68 kg   Weight change:  Body mass index is 24.21 kg/m.   Physical Exam: General exam: Appears calm and comfortable.  Respite distress on minimal ambulation. Skin: No rashes, lesions or ulcers. HEENT: Atraumatic, normocephalic, supple neck, no obvious bleeding Lungs: Bilateral scattered wheezing.  No crackles.   CVS: Regular rate and rhythm, no murmur GI/Abd soft, nontender, nondistended, bowel sounds present CNS: Alert, awake, oriented x3 Psychiatry: In good mood today Extremities: No pedal edema, no calf tenderness  Data Review: I have personally reviewed the laboratory data and studies available.  Recent Labs  Lab 10/24/19 1640  WBC 6.7  HGB 15.2*  HCT 47.5*  MCV 96.0  PLT 448*   Recent Labs  Lab 10/24/19 1640 10/26/19 0503  NA 139 140  K 3.8 4.0  CL 104 106  CO2 26 24  GLUCOSE 133* 133*  BUN 7* 11  CREATININE 0.76 0.62  CALCIUM 9.2 8.8*   No results found for: HGBA1C  No results found for: CHOL, TRIG, HDL, CHOLHDL, VLDL, LDLCALC, LDLDIRECT, LABVLDL  Signed, Terrilee Croak, MD [facility] Pager: [phone] (Secure Chat preferred). 10/28/2019

## 2019-10-28 NOTE — TOC Progression Note (Signed)
Transition of Care [facility]) - Progression Note    Patient Details  Name: Heidi Ramirez MRN: [id number] Date of Birth: [birth year]  [facility] CM/SW Contact  Pollie Friar, RN Phone Number: 10/28/2019, 12:47 PM  Clinical Narrative:    CM met with the patient and her daughter at the bedside. [facility] selected for home oxygen. Zack with [employer] will have oxygen arranged for home.  Pt going to daughters home: [place] in [place] Kristi: [phone] TOC following.   Expected Discharge Plan: [place] Barriers to Discharge: Continued Medical Work up  Expected Discharge Plan and Services Expected Discharge Plan: [place]                         DME Arranged: Oxygen                     Social Determinants of Health (SDOH) Interventions    Readmission Risk Interventions No flowsheet data found.

## 2019-10-29 DIAGNOSIS — J449 Chronic obstructive pulmonary disease, unspecified: Secondary | ICD-10-CM | POA: Diagnosis not present

## 2019-10-29 MED ORDER — PREDNISONE 10 MG PO TABS: ORAL_TABLET | ORAL | 0 refills | Status: DC

## 2019-10-29 MED ORDER — GUAIFENESIN ER 600 MG PO TB12: 600.0000 mg | ORAL_TABLET | Freq: Two times a day (BID) | ORAL | 0 refills | Status: AC

## 2019-10-29 MED ORDER — AMLODIPINE BESYLATE 5 MG PO TABS: 5.0000 mg | ORAL_TABLET | Freq: Every day | ORAL | 0 refills | Status: AC

## 2019-10-29 NOTE — Discharge Summary (Signed)
Physician Discharge Summary  Heidi Ramirez DXA:[id number] DOB: Jan 01, 1951 DOA: 10/24/2019  PCP: Jonathon Jordan, MD  Admit date: 10/24/2019 Discharge date: 10/29/2019  Admitted From: Home Discharge disposition: Home with home oxygen   Code Status: Full Code  Diet Recommendation: Cardiac diet  Discharge Diagnosis:   Principal Problem:   COPD (chronic obstructive pulmonary disease) ([place]) Active Problems:   HTN (hypertension)   Hypoxia   Smoking   Chronic rhinitis  History of Present Illness / Brief narrative:  Heidi Ramirez is a 69 y.o. female with PMH of chronic smoking, COPD, not on home O2, hypertension, melanoma. Patient presented to the ED on 10/24/2019  with complaints of worsening dyspnea for few days that started after moving an old carpet 2 days prior to symptoms onset.  Patient continues to smoke 3 to 4 cigarettes/day. Never been on home O2. She used rescue inhalers without any relief. In the ED, she was hypoxic at 88%, initially required up to 5 L oxygen by nasal cannula. CBC/BMP unremarkable. COVID-19 negative.  Chest x-ray did not show any acute pulmonary process. Patient was admitted for COPD exacerbation.  Hospital Course:   Acute hypoxic respiratory failure COPD exacerbation -No infiltrates on chest x-ray. COVID-19 ruled out.  -Was not on oxygen at home.  Initially required up to 5 L oxygen by nasal cannula.  Currently not on supplemental oxygen at rest and on 2 to 3 L on ambulation.  Qualified for home oxygen -Treated with IV Solu-Medrol, IV Rocephin, Mucinex and bronchodilators.  -Completed 5 days of IV Rocephin in the hospital.  No need of antibiotics at discharge.  -Currently on Solu-Medrol 60 mg twice daily.  Being tapered off.  Switch to oral prednisone at discharge to complete taper in the next 7 to 10 days.  -Continue Anoro and rescue inhaler at home. -Continue Mucinex, incentive spirometry, flutter valve. -Reports full vaccination with Pfizer  vaccine series in March 2021.   Tobacco use:  -Smokes 5 to 6 cigarettes a day.  Restates her commitment to quit  Hypertension Chronic diastolic CHF, not in exacerbation -Currently blood pressure elevated in the setting of respiratory distress and IV steroids. -Uses amlodipine 2.5 mg daily at home.  Increased to 5 mg daily from today. -Echocardiogram showed EF of 60 to 65% with grade 1 diastolic dysfunction.  Stable to discharge home today on home oxygen  Wound care:    Subjective:  Seen and examined this morning.  Pleasant elderly Caucasian female.  Sitting up in bed.  Not in distress.  Clear to auscultation on examination today.  Family at bedside.  Patient feels ready to go home.   Discharge Exam:   Vitals:   10/28/19 1618 10/28/19 2046 10/29/19 0758 10/29/19 0851  BP: (!) 146/75 (!) 156/77 (!) 163/82   Pulse: 77 80 70   Resp: 16  20   Temp: 98.3 F (36.8 C) 98.1 F (36.7 C) 97.8 F (36.6 C)   TempSrc:  Oral    SpO2: 91% 100% 94% 95%  Weight:      Height:        Body mass index is 24.21 kg/m.  General exam: Appears calm and comfortable.  Not in physical distress Skin: No rashes, lesions or ulcers. HEENT: Atraumatic, normocephalic, supple neck, no obvious bleeding Lungs: Clear to auscultation bilaterally CVS: Regular rate and rhythm, no murmur GI/Abd soft, nontender, nondistended, bowel sound present CNS: Alert, awake monitor x3 Psychiatry: Mood appropriate Extremities: No pedal edema, no calf tenderness  Follow  ups:   Discharge Instructions    Diet - low sodium heart healthy   Complete by: As directed    Increase activity slowly   Complete by: As directed       Follow-up Information    Jonathon Jordan, MD Follow up.   Specialty: Family Medicine Contact information: [address] [place] [address]               Recommendations for Outpatient Follow-Up:   1. Follow-up with PCP as an outpatient  Discharge Instructions:  Follow with  Primary MD Jonathon Jordan, MD in 7 days   Get CBC/BMP and chest x-ray checked in next visit within 1 week by PCP or SNF MD ( we routinely change or add medications that can affect your baseline labs and fluid status, therefore we recommend that you get the mentioned basic workup next visit with your PCP, your PCP may decide not to get them or add new tests based on their clinical decision)  On your next visit with your PCP, please Get Medicines reviewed and adjusted.  Please request your PCP  to go over all Hospital Tests and Procedure/Radiological results at the follow up, please get all Hospital records sent to your Prim MD by signing hospital release before you go home.  Activity: As tolerated with Full fall precautions use walker/cane & assistance as needed  For Heart failure patients - Check your Weight same time everyday, if you gain over 2 pounds, or you develop in leg swelling, experience more shortness of breath or chest pain, call your Primary MD immediately. Follow Cardiac Low Salt Diet and 1.5 lit/day fluid restriction.  If you have smoked or chewed Tobacco in the last 2 yrs please stop smoking, stop any regular Alcohol  and or any Recreational drug use.  If you experience worsening of your admission symptoms, develop shortness of breath, life threatening emergency, suicidal or homicidal thoughts you must seek medical attention immediately by calling 911 or calling your MD immediately  if symptoms less severe.  You Must read complete instructions/literature along with all the possible adverse reactions/side effects for all the Medicines you take and that have been prescribed to you. Take any new Medicines after you have completely understood and accpet all the possible adverse reactions/side effects.   Do not drive, operate heavy machinery, perform activities at heights, swimming or participation in water activities or provide baby sitting services if your were admitted for syncope or  siezures until you have seen by Primary MD or a Neurologist and advised to do so again.  Do not drive when taking Pain medications.  Do not take more than prescribed Pain, Sleep and Anxiety Medications  Wear Seat belts while driving.   Please note You were cared for by a hospitalist during your hospital stay. If you have any questions about your discharge medications or the care you received while you were in the hospital after you are discharged, you can call the unit and asked to speak with the hospitalist on call if the hospitalist that took care of you is not available. Once you are discharged, your primary care physician will handle any further medical issues. Please note that NO REFILLS for any discharge medications will be authorized once you are discharged, as it is imperative that you return to your primary care physician (or establish a relationship with a primary care physician if you do not have one) for your aftercare needs so that they can reassess your need for medications and  monitor your lab values.   Allergies as of 10/29/2019   No Known Allergies     Medication List    TAKE these medications   AeroChamber MV inhaler Use as instructed What changed:   how much to take  how to take this  when to take this  additional instructions   albuterol 108 (90 Base) MCG/ACT inhaler Commonly known as: VENTOLIN HFA Inhale 2 puffs into the lungs every 6 (six) hours as needed for wheezing or shortness of breath.   amLODipine 5 MG tablet Commonly known as: NORVASC Take 1 tablet (5 mg total) by mouth daily. What changed:   medication strength  how much to take   Anoro Ellipta 62.5-25 MCG/INH Aepb Generic drug: umeclidinium-vilanterol Inhale 1 puff into the lungs daily.   guaiFENesin 600 MG 12 hr tablet Commonly known as: MUCINEX Take 1 tablet (600 mg total) by mouth 2 (two) times daily for 14 days.   predniSONE 10 MG tablet Commonly known as: DELTASONE Take 4  tablets (40 mg) twice daily for 2 days, then, Take 4 tablets (40 mg) daily for 2 days, then, Take 3 tablets (30 mg) daily for 2 days, then, Take 2 tablets (20 mg) daily for 2 days, then, Take 1 tablets (10 mg) daily for 2 days, then stop            Durable Medical Equipment  (From admission, onward)         Start     Ordered   10/28/19 1158  For home use only DME oxygen  Once       Question Answer Comment  Length of Need Lifetime   Mode or (Route) Nasal cannula   Liters per Minute 2   Frequency Continuous (stationary and portable oxygen unit needed)   Oxygen conserving device Yes   Oxygen delivery system Gas      10/28/19 1157          Time coordinating discharge: 35 minutes  The results of significant diagnostics from this hospitalization (including imaging, microbiology, ancillary and laboratory) are listed below for reference.    Procedures and Diagnostic Studies:   DG Chest 2 View  Result Date: 10/24/2019 CLINICAL DATA:  69 year old female with shortness of breath. EXAM: CHEST - 2 VIEW COMPARISON:  Chest radiograph dated 01/21/2019. FINDINGS: No focal consolidation, pleural effusion, or pneumothorax. There is background of emphysema. The cardiac silhouette is within limits. Atherosclerotic calcification of the aorta. Degenerative changes of the spine. No acute osseous pathology. IMPRESSION: No active cardiopulmonary disease. Electronically Signed   By: Anner Crete M.D.   On: 10/24/2019 18:06     Labs:   Basic Metabolic Panel: Recent Labs  Lab 10/24/19 1640 10/26/19 0503  NA 139 140  K 3.8 4.0  CL 104 106  CO2 26 24  GLUCOSE 133* 133*  BUN 7* 11  CREATININE 0.76 0.62  CALCIUM 9.2 8.8*   GFR Estimated Creatinine Clearance: 63.9 mL/min (by C-G formula based on SCr of 0.62 mg/dL). Liver Function Tests: No results for input(s): AST, ALT, ALKPHOS, BILITOT, PROT, ALBUMIN in the last 168 hours. No results for input(s): LIPASE, AMYLASE in the last 168  hours. No results for input(s): AMMONIA in the last 168 hours. Coagulation profile No results for input(s): INR, PROTIME in the last 168 hours.  CBC: Recent Labs  Lab 10/24/19 1640  WBC 6.7  HGB 15.2*  HCT 47.5*  MCV 96.0  PLT 448*   Cardiac Enzymes: No results for input(s): CKTOTAL,  CKMB, CKMBINDEX, TROPONINI in the last 168 hours. BNP: Invalid input(s): POCBNP CBG: No results for input(s): GLUCAP in the last 168 hours. D-Dimer No results for input(s): DDIMER in the last 72 hours. Hgb A1c No results for input(s): HGBA1C in the last 72 hours. Lipid Profile No results for input(s): CHOL, HDL, LDLCALC, TRIG, CHOLHDL, LDLDIRECT in the last 72 hours. Thyroid function studies No results for input(s): TSH, T4TOTAL, T3FREE, THYROIDAB in the last 72 hours.  Invalid input(s): FREET3 Anemia work up No results for input(s): VITAMINB12, FOLATE, FERRITIN, TIBC, IRON, RETICCTPCT in the last 72 hours. Microbiology Recent Results (from the past 240 hour(s))  SARS Coronavirus 2 by RT PCR (hospital order, performed in [facility] hospital lab) Nasopharyngeal Nasopharyngeal Swab     Status: None   Collection Time: 10/25/19  6:50 AM   Specimen: Nasopharyngeal Swab  Result Value Ref Range Status   SARS Coronavirus 2 NEGATIVE NEGATIVE Final    Comment: (NOTE) SARS-CoV-2 target nucleic acids are NOT DETECTED.  The SARS-CoV-2 RNA is generally detectable in upper and lower respiratory specimens during the acute phase of infection. The lowest concentration of SARS-CoV-2 viral copies this assay can detect is 250 copies / mL. A negative result does not preclude SARS-CoV-2 infection and should not be used as the sole basis for treatment or other patient management decisions.  A negative result may occur with improper specimen collection / handling, submission of specimen other than nasopharyngeal swab, presence of viral mutation(s) within the areas targeted by this assay, and inadequate number  of viral copies (<250 copies / mL). A negative result must be combined with clinical observations, patient history, and epidemiological information.  Fact Sheet for Patients:   StrictlyIdeas.no  Fact Sheet for Healthcare Providers: BankingDealers.co.za  This test is not yet approved or  cleared by the [place] FDA and has been authorized for detection and/or diagnosis of SARS-CoV-2 by FDA under an Emergency Use Authorization (EUA).  This EUA will remain in effect (meaning this test can be used) for the duration of the COVID-19 declaration under Section 564(b)(1) of the Act, 21 U.S.C. section 360bbb-3(b)(1), unless the authorization is terminated or revoked sooner.  Performed at [facility], [place] [address], [place], [place] [address]      Signed: Terrilee Croak  [facility] 10/29/2019, 10:48 AM

## 2019-10-29 NOTE — Discharge Instructions (Signed)
Chronic Obstructive Pulmonary Disease Chronic obstructive pulmonary disease (COPD) is a long-term (chronic) lung problem. When you have COPD, it is hard for air to get in and out of your lungs. Usually the condition gets worse over time, and your lungs will never return to normal. There are things you can do to keep yourself as healthy as possible.  Your doctor may treat your condition with: ? Medicines. ? Oxygen. ? Lung surgery.  Your doctor may also recommend: ? Rehabilitation. This includes steps to make your body work better. It may involve a team of specialists. ? Quitting smoking, if you smoke. ? Exercise and changes to your diet. ? Comfort measures (palliative care). Follow these instructions at home: Medicines  Take over-the-counter and prescription medicines only as told by your doctor.  Talk to your doctor before taking any cough or allergy medicines. You may need to avoid medicines that cause your lungs to be dry. Lifestyle  If you smoke, stop. Smoking makes the problem worse. If you need help quitting, ask your doctor.  Avoid being around things that make your breathing worse. This may include smoke, chemicals, and fumes.  Stay active, but remember to rest as well.  Learn and use tips on how to relax.  Make sure you get enough sleep. Most adults need at least 7 hours of sleep every night.  Eat healthy foods. Eat smaller meals more often. Rest before meals. Controlled breathing Learn and use tips on how to control your breathing as told by your doctor. Try:  Breathing in (inhaling) through your nose for 1 second. Then, pucker your lips and breath out (exhale) through your lips for 2 seconds.  Putting one hand on your belly (abdomen). Breathe in slowly through your nose for 1 second. Your hand on your belly should move out. Pucker your lips and breathe out slowly through your lips. Your hand on your belly should move in as you breathe out.  Controlled coughing Learn  and use controlled coughing to clear mucus from your lungs. Follow these steps: 1. Lean your head a little forward. 2. Breathe in deeply. 3. Try to hold your breath for 3 seconds. 4. Keep your mouth slightly open while coughing 2 times. 5. Spit any mucus out into a tissue. 6. Rest and do the steps again 1 or 2 times as needed. General instructions  Make sure you get all the shots (vaccines) that your doctor recommends. Ask your doctor about a flu shot and a pneumonia shot.  Use oxygen therapy and pulmonary rehabilitation if told by your doctor. If you need home oxygen therapy, ask your doctor if you should buy a tool to measure your oxygen level (oximeter).  Make a COPD action plan with your doctor. This helps you to know what to do if you feel worse than usual.  Manage any other conditions you have as told by your doctor.  Avoid going outside when it is very hot, cold, or humid.  Avoid people who have a sickness you can catch (contagious).  Keep all follow-up visits as told by your doctor. This is important. Contact a doctor if:  You cough up more mucus than usual.  There is a change in the color or thickness of the mucus.  It is harder to breathe than usual.  Your breathing is faster than usual.  You have trouble sleeping.  You need to use your medicines more often than usual.  You have trouble doing your normal activities such as getting dressed   or walking around the house. Get help right away if:  You have shortness of breath while resting.  You have shortness of breath that stops you from: ? Being able to talk. ? Doing normal activities.  Your chest hurts for longer than 5 minutes.  Your skin color is more blue than usual.  Your pulse oximeter shows that you have low oxygen for longer than 5 minutes.  You have a fever.  You feel too tired to breathe normally. Summary  Chronic obstructive pulmonary disease (COPD) is a long-term lung problem.  The way your  lungs work will never return to normal. Usually the condition gets worse over time. There are things you can do to keep yourself as healthy as possible.  Take over-the-counter and prescription medicines only as told by your doctor.  If you smoke, stop. Smoking makes the problem worse. This information is not intended to replace advice given to you by your health care provider. Make sure you discuss any questions you have with your health care provider. Document Revised: 02/27/2017 Document Reviewed: 04/21/2016 Elsevier Patient Education  2020 Elsevier Inc.  

## 2019-11-07 DIAGNOSIS — J9611 Chronic respiratory failure with hypoxia: Secondary | ICD-10-CM | POA: Diagnosis not present

## 2019-11-07 DIAGNOSIS — I10 Essential (primary) hypertension: Secondary | ICD-10-CM | POA: Diagnosis not present

## 2019-11-07 DIAGNOSIS — J449 Chronic obstructive pulmonary disease, unspecified: Secondary | ICD-10-CM | POA: Diagnosis not present

## 2019-11-07 DIAGNOSIS — I5032 Chronic diastolic (congestive) heart failure: Secondary | ICD-10-CM | POA: Diagnosis not present

## 2020-01-12 DIAGNOSIS — Z23 Encounter for immunization: Secondary | ICD-10-CM | POA: Diagnosis not present

## 2020-01-27 DIAGNOSIS — Z23 Encounter for immunization: Secondary | ICD-10-CM | POA: Diagnosis not present

## 2020-02-29 ENCOUNTER — Other Ambulatory Visit: Payer: Self-pay | Admitting: *Deleted

## 2020-02-29 DIAGNOSIS — J449 Chronic obstructive pulmonary disease, unspecified: Secondary | ICD-10-CM

## 2020-03-01 ENCOUNTER — Ambulatory Visit (INDEPENDENT_AMBULATORY_CARE_PROVIDER_SITE_OTHER): Payer: Medicare Other | Admitting: Pulmonary Disease

## 2020-03-01 ENCOUNTER — Other Ambulatory Visit: Payer: Self-pay

## 2020-03-01 ENCOUNTER — Encounter: Payer: Self-pay | Admitting: Pulmonary Disease

## 2020-03-01 DIAGNOSIS — J449 Chronic obstructive pulmonary disease, unspecified: Secondary | ICD-10-CM

## 2020-03-01 DIAGNOSIS — J432 Centrilobular emphysema: Secondary | ICD-10-CM | POA: Diagnosis not present

## 2020-03-01 DIAGNOSIS — R0902 Hypoxemia: Secondary | ICD-10-CM | POA: Diagnosis not present

## 2020-03-01 LAB — PULMONARY FUNCTION TEST
FEF 25-75 Post: 1.16 L/sec
FEF 25-75 Pre: 0.66 L/sec
FEF2575-%Change-Post: 75 %
FEF2575-%Pred-Post: 55 %
FEF2575-%Pred-Pre: 31 %
FEV1-%Change-Post: 16 %
FEV1-%Pred-Post: 61 %
FEV1-%Pred-Pre: 52 %
FEV1-Post: 1.55 L
FEV1-Pre: 1.33 L
FEV1FVC-%Change-Post: 7 %
FEV1FVC-%Pred-Pre: 84 %
FEV6-%Change-Post: 8 %
FEV6-%Pred-Post: 70 %
FEV6-%Pred-Pre: 64 %
FEV6-Post: 2.23 L
FEV6-Pre: 2.05 L
FEV6FVC-%Change-Post: 0 %
FEV6FVC-%Pred-Post: 104 %
FEV6FVC-%Pred-Pre: 104 %
FVC-%Change-Post: 8 %
FVC-%Pred-Post: 67 %
FVC-%Pred-Pre: 62 %
FVC-Post: 2.23 L
FVC-Pre: 2.06 L
Post FEV1/FVC ratio: 69 %
Post FEV6/FVC ratio: 100 %
Pre FEV1/FVC ratio: 64 %
Pre FEV6/FVC Ratio: 100 %

## 2020-03-01 MED ORDER — TRELEGY ELLIPTA 200-62.5-25 MCG/INH IN AEPB
1.0000 | INHALATION_SPRAY | Freq: Every day | RESPIRATORY_TRACT | 0 refills | Status: DC
Start: 2020-03-01 — End: 2020-03-12

## 2020-03-01 NOTE — Assessment & Plan Note (Signed)
Ambulatory saturation. Discontinue oxygen

## 2020-03-01 NOTE — Assessment & Plan Note (Signed)
Lung function is stable.  I congratulated her on smoking cessation. Since she has some reversibility of PFTs, we will escalate from Anoro to Trelegy once daily. Referral to pulmonary rehab.  We discussed signs and symptoms of COPD exacerbation, she would need early treatment with antibiotics and prednisone should she have future exacerbation.  She will call her office should this occur

## 2020-03-01 NOTE — Addendum Note (Signed)
Addended by: Merrilee Seashore on: 03/01/2020 04:32 PM   Modules accepted: Orders

## 2020-03-01 NOTE — Progress Notes (Signed)
   Subjective:    Patient ID: Heidi Ramirez, female    DOB: 07/24/1950, 69 y.o.   MRN: [id number]  HPI   69 yo smoker for follow-up of COPD  Hospitalization 03/19/2018 COPD exacerbation Last flareup 11/2018  She switched from Spiriva to LABA/LAMA combination, initially on Stiolto, now on Anoro  She was hospitalized 09/2019, for COPD exacerbation, she was changing oral carpets, oxygen saturation was 88% on admit required up to 5 L oxygen, she was discharged on oxygen.  She has only used this twice in the last 4 months, lowest saturation has been 93 to 94% range. She is compliant with Stiolto, has not needed albuterol much. Lung function was reviewed today. She is able to quit smoking again since her hospitalization for 4 months  Significant tests/ events reviewed  Spirometry 02/2020 moderate airway obstruction, ratio 64, FEV1 1.33/52%, FVC 62%, 16% bronchodilator response 03/2018 spirometry  ratio of 79, FEV1 of 60% FVC of 59%, surprisingly not much of obstruction  CT chest 02/2018 lungs with no infiltrates or effusions slight peribronchial thickening aortic atherosclerosis   Review of Systems neg for any significant sore throat, dysphagia, itching, sneezing, nasal congestion or excess/ purulent secretions, fever, chills, sweats, unintended wt loss, pleuritic or exertional cp, hempoptysis, orthopnea pnd or change in chronic leg swelling. Also denies presyncope, palpitations, heartburn, abdominal pain, nausea, vomiting, diarrhea or change in bowel or urinary habits, dysuria,hematuria, rash, arthralgias, visual complaints, headache, numbness weakness or ataxia.     Objective:   Physical Exam  Gen. Pleasant, well-nourished, in no distress ENT - no thrush, no pallor/icterus,no post nasal drip Neck: No JVD, no thyromegaly, no carotid bruits Lungs: no use of accessory muscles, no dullness to percussion, clear without rales or rhonchi  Cardiovascular: Rhythm regular, heart sounds   normal, no murmurs or gallops, no peripheral edema Musculoskeletal: No deformities, no cyanosis or clubbing         Assessment & Plan:

## 2020-03-01 NOTE — Progress Notes (Addendum)
Spirometry pre and post performed today. 

## 2020-03-01 NOTE — Patient Instructions (Signed)
Ambulatory satn & discontinue oxygen Congratulations on quitting smoking ! Referral to pulmonary rehab Sample of trelegy - call us back for Rx if this works

## 2020-03-08 ENCOUNTER — Telehealth (HOSPITAL_COMMUNITY): Payer: Self-pay | Admitting: *Deleted

## 2020-03-08 NOTE — Telephone Encounter (Signed)
Pt called regarding referral for pulmonary rehab.  Advised pt that we have received her referral on 12/3.  Her medical history will be reviewed for appropriateness to proceed with scheduling pulmonary rehab. Insurance benefits will be verified January 2022.  Pt indicated that her insurance company will not change in 2022.  Gave pt a general overview of pulmonary rehab.  Answered any questions.  Pt aware she will be contacted for scheduling. Cherre Huger, BSN Cardiac and Training and development officer

## 2020-03-12 ENCOUNTER — Telehealth: Payer: Self-pay | Admitting: Pulmonary Disease

## 2020-03-12 MED ORDER — TRELEGY ELLIPTA 200-62.5-25 MCG/INH IN AEPB: 1.0000 | INHALATION_SPRAY | Freq: Every day | RESPIRATORY_TRACT | 6 refills | Status: DC

## 2020-03-12 NOTE — Telephone Encounter (Signed)
Called spoke with patient.  Let her know RX was sent Nothing further needed at this time.

## 2020-03-14 ENCOUNTER — Telehealth (HOSPITAL_COMMUNITY): Payer: Self-pay | Admitting: Family Medicine

## 2020-03-15 ENCOUNTER — Encounter (HOSPITAL_COMMUNITY): Payer: Self-pay | Admitting: *Deleted

## 2020-03-15 NOTE — Progress Notes (Signed)
Received referral from Dr. Elsworth Soho  for this pt to participate in pulmonary rehab with the the diagnosis of Centrilobular emphysema. Clinical review of pt follow up appt on 12/2 Pulmonary office note.  Pt with Covid Risk Score - 3. Pt appropriate for scheduling for Pulmonary rehab.  Will forward to support staff for scheduling and verification of insurance eligibility/benefits with pt consent. Cherre Huger, BSN Cardiac and Training and development officer

## 2020-04-09 ENCOUNTER — Telehealth (HOSPITAL_COMMUNITY): Payer: Self-pay

## 2020-04-09 NOTE — Telephone Encounter (Signed)
Pt insurance is active and benefits verified through [employer] a/b Co-pay 0, DED $233/0 met, out of pocket 0/0 met, co-insurance 20%. no pre-authorization required.  2ndary insurance is active and benefits verified through [facility]. Co-pay 0, DED 0/0 met, out of pocket 0/0 met, co-insurance 0%. No pre-authorization required.

## 2020-05-03 ENCOUNTER — Telehealth (HOSPITAL_COMMUNITY): Payer: Self-pay

## 2020-05-03 NOTE — Telephone Encounter (Signed)
Called pt to confirm appointment with pulmonary rehab tomorrow 05/04/20 at 0900. Pt confirmed. Instructed pt to wear closed toed/heel shoes and a mask. Pt verbalized understanding.

## 2020-05-04 ENCOUNTER — Encounter (HOSPITAL_COMMUNITY): Payer: Self-pay

## 2020-05-04 ENCOUNTER — Encounter (HOSPITAL_COMMUNITY)
Admission: RE | Admit: 2020-05-04 | Discharge: 2020-05-04 | Disposition: A | Discharge status: Home / Self Care | Payer: Medicare Other | Source: Ambulatory Visit | Attending: Pulmonary Disease | Admitting: Pulmonary Disease

## 2020-05-04 ENCOUNTER — Other Ambulatory Visit: Payer: Self-pay

## 2020-05-04 VITALS — BP 142/72 | HR 78 | Ht 66.0 in | Wt 162.9 lb

## 2020-05-04 DIAGNOSIS — R0902 Hypoxemia: Secondary | ICD-10-CM | POA: Diagnosis not present

## 2020-05-04 DIAGNOSIS — Z87891 Personal history of nicotine dependence: Secondary | ICD-10-CM | POA: Insufficient documentation

## 2020-05-04 DIAGNOSIS — J432 Centrilobular emphysema: Secondary | ICD-10-CM | POA: Diagnosis not present

## 2020-05-04 NOTE — Progress Notes (Signed)
Heidi Ramirez 70 y.o. female Pulmonary Rehab Orientation Note This patient who was referred to Pulmonary rehab by Dr. Elsworth Soho with the diagnosis of Centrilobular Emphysema arrived today in Cardiac and Pulmonary Rehab. She arrived ambulatory with normal gait. She does not carry portable oxygen. Per pt, she uses oxygen never. Color good, skin warm and dry. Patient is oriented to time and place. Patient's medical history, psychosocial health, and medications reviewed. Psychosocial assessment reveals pt lives alone. Pt is currently retired. Pt hobbies include gardening. Pt reports her stress level is low. Areas of stress/anxiety include Health.  Pt does not exhibit signs of depression. PHQ2/9 score 0/0. Pt shows good coping skills with positive outlook. Aislin was offered emotional support and reassurance. Will continue to monitor and evaluate progress toward psychosocial goal(s) of improving quality of life. Patient reports she does take medications as prescribed. Patient states she follows a Regular diet. The patient reports no specific efforts to gain or lose weight. Patient's weight will be monitored closely. Demonstration and practice of PLB using pulse oximeter. Patient able to return demonstration satisfactorily. Safety and hand hygiene in the exercise area reviewed with patient. Patient voices understanding of the information reviewed. Department expectations discussed with patient and achievable goals were set. The patient shows enthusiasm about attending the program and we look forward to working with this nice lady. The patient completed a 6 min walk test today and is scheduled to begin exercise on 05/08/20.  45 minutes was spent on a variety of activities such as assessment of the patient, obtaining baseline data including height, weight, BMI, and grip strength, verifying medical history, allergies, and current medications, and teaching patient strategies for performing tasks with less respiratory effort with  emphasis on pursed lip breathing.  Rick Duff MS, ACSM CEP

## 2020-05-04 NOTE — Progress Notes (Signed)
Pulmonary Individual Treatment Plan  Patient Details  Name: Heidi Ramirez MRN: [id number] Date of Birth: [birth year] Referring Provider:   April Manson Pulmonary Rehab Walk Test from 05/04/2020 in [place]  Referring Provider Dr. Elsworth Soho      Initial Encounter Date:  Flowsheet Row Pulmonary Rehab Walk Test from 05/04/2020 in [place]  Date 05/04/20      Visit Diagnosis: Centrilobular emphysema ([place])  Patient's Home Medications on Admission:   Current Outpatient Medications:  .  albuterol (VENTOLIN HFA) 108 (90 Base) MCG/ACT inhaler, Inhale 2 puffs into the lungs every 6 (six) hours as needed for wheezing or shortness of breath., Disp: 8 g, Rfl: 1 .  amLODipine (NORVASC) 5 MG tablet, Take 1 tablet (5 mg total) by mouth daily., Disp: 30 tablet, Rfl: 0 .  Fluticasone-Umeclidin-Vilant (TRELEGY ELLIPTA) 200-62.5-25 MCG/INH AEPB, Inhale 1 puff into the lungs daily., Disp: 60 each, Rfl: 6 .  Spacer/Aero-Holding Chambers (AEROCHAMBER MV) inhaler, Use as instructed (Patient taking differently: 1 each by Other route as directed. ), Disp: 1 each, Rfl: 0  Past Medical History: Past Medical History:  Diagnosis Date  . Heart murmur    since birth  . HTN (hypertension)   . 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 Packs/day: 0.25  . Years: 59.00  . Pack years: 14.75  . Types: Cigarettes  . Start date: 1961  Smokeless Tobacco Never Used  Tobacco Comment   Quit end of july 2021    Labs: Recent Review Flowsheet Data   There is no flowsheet data to display.     Capillary Blood Glucose: No results found for: GLUCAP   Pulmonary Assessment Scores:  Pulmonary Assessment Scores    Row Name 05/04/20 1026         ADL UCSD   ADL Phase Entry     SOB Score total 4           CAT Score   CAT Score 4           mMRC Score   mMRC Score 1            UCSD: Self-administered rating of dyspnea associated with activities of daily living (ADLs) 6-point scale (0 = "not at all" to 5 = "maximal or unable to do because of breathlessness")  Scoring Scores range from 0 to 120.  Minimally important difference is 5 units  CAT: CAT can identify the health impairment of COPD patients and is better correlated with disease progression.  CAT has a scoring range of zero to 40. The CAT score is classified into four groups of low (less than 10), medium (10 - 20), high (21-30) and very high (31-40) based on the impact level of disease on health status. A CAT score over 10 suggests significant symptoms.  A worsening CAT score could be explained by an exacerbation, poor medication adherence, poor inhaler technique, or progression of COPD or comorbid conditions.  CAT MCID is 2 points  mMRC: mMRC (Modified Medical Research Council) Dyspnea Scale is used to assess the degree of baseline functional disability in patients of respiratory disease due to dyspnea. No minimal important difference is established. A decrease in score of 1 point or greater is considered a positive change.   Pulmonary Function Assessment:  Pulmonary Function Assessment - 05/04/20 0920      Breath   Shortness  of Breath Yes;Limiting activity           Exercise Target Goals: Exercise Program Goal: Individual exercise prescription set using results from initial 6 min walk test and THRR while considering  patient's activity barriers and safety.   Exercise Prescription Goal: Initial exercise prescription builds to 30-45 minutes a day of aerobic activity, 2-3 days per week.  Home exercise guidelines will be given to patient during program as part of exercise prescription that the participant will acknowledge.  Activity Barriers & Risk Stratification:  Activity Barriers & Cardiac Risk Stratification - 05/04/20 0919      Activity Barriers & Cardiac Risk Stratification   Activity  Barriers Shortness of Breath;Deconditioning    Cardiac Risk Stratification Low           6 Minute Walk:  6 Minute Walk    Row Name 05/04/20 1230         6 Minute Walk   Phase Initial     Distance 1393 feet     Walk Time 6 minutes     # of Rest Breaks 0     MPH 2.64     METS 3.64     RPE 9     Perceived Dyspnea  1     VO2 Peak 12.74     Symptoms No     Resting HR 78 bpm     Resting BP 142/72     Resting Oxygen Saturation  95 %     Exercise Oxygen Saturation  during 6 min walk 92 %     Max Ex. HR 109 bpm     Max Ex. BP 172/60     2 Minute Post BP 150/64           Interval HR   1 Minute HR 104     2 Minute HR 102     3 Minute HR 109     4 Minute HR 107     5 Minute HR 106     6 Minute HR 90     2 Minute Post HR 81     Interval Heart Rate? Yes           Interval Oxygen   Interval Oxygen? Yes     Baseline Oxygen Saturation % 95 %     1 Minute Oxygen Saturation % 93 %     1 Minute Liters of Oxygen 0 L     2 Minute Oxygen Saturation % 92 %     2 Minute Liters of Oxygen 0 L     3 Minute Oxygen Saturation % 93 %     3 Minute Liters of Oxygen 0 L     4 Minute Oxygen Saturation % 94 %     4 Minute Liters of Oxygen 0 L     5 Minute Oxygen Saturation % 94 %     5 Minute Liters of Oxygen 0 L     6 Minute Oxygen Saturation % 95 %     6 Minute Liters of Oxygen 0 L     2 Minute Post Oxygen Saturation % 97 %     2 Minute Post Liters of Oxygen 0 L            Oxygen Initial Assessment:  Oxygen Initial Assessment - 05/04/20 0920      Home Oxygen   Home Oxygen Device None    Sleep Oxygen Prescription None    Home Exercise Oxygen Prescription None  Home Resting Oxygen Prescription None      Initial 6 min Walk   Oxygen Used None      Program Oxygen Prescription   Program Oxygen Prescription None      Intervention   Short Term Goals To learn and understand importance of monitoring SPO2 with pulse oximeter and demonstrate accurate use of the pulse  oximeter.;To learn and understand importance of maintaining oxygen saturations>88%;To learn and demonstrate proper pursed lip breathing techniques or other breathing techniques.;To learn and demonstrate proper use of respiratory medications    Long  Term Goals Verbalizes importance of monitoring SPO2 with pulse oximeter and return demonstration;Maintenance of O2 saturations>88%;Exhibits proper breathing techniques, such as pursed lip breathing or other method taught during program session;Compliance with respiratory medication;Demonstrates proper use of MDI's           Oxygen Re-Evaluation:   Oxygen Discharge (Final Oxygen Re-Evaluation):   Initial Exercise Prescription:  Initial Exercise Prescription - 05/04/20 1400      Date of Initial Exercise RX and Referring Provider   Date 05/04/20    Referring Provider Dr. Elsworth Soho    Expected Discharge Date 07/05/20      Treadmill   MPH 2.2    Grade 0    Minutes 15      NuStep   Level 2    SPM 80    Minutes 15      Prescription Details   Frequency (times per week) 2    Duration Progress to 45 minutes of aerobic exercise without signs/symptoms of physical distress      Intensity   THRR 40-80% of Max Heartrate 61-122    Ratings of Perceived Exertion 11-13    Perceived Dyspnea 0-4      Progression   Progression Continue to progress workloads to maintain intensity without signs/symptoms of physical distress.      Resistance Training   Training Prescription Yes    Weight Blue bands    Reps 10-15           Perform Capillary Blood Glucose checks as needed.  Exercise Prescription Changes:   Exercise Comments:   Exercise Goals and Review:  Exercise Goals    Row Name 05/04/20 1017             Exercise Goals   Increase Physical Activity Yes       Intervention Provide advice, education, support and counseling about physical activity/exercise needs.;Develop an individualized exercise prescription for aerobic and resistive  training based on initial evaluation findings, risk stratification, comorbidities and participant's personal goals.       Expected Outcomes Short Term: Attend rehab on a regular basis to increase amount of physical activity.;Long Term: Add in home exercise to make exercise part of routine and to increase amount of physical activity.;Long Term: Exercising regularly at least 3-5 days a week.       Increase Strength and Stamina Yes       Intervention Provide advice, education, support and counseling about physical activity/exercise needs.;Develop an individualized exercise prescription for aerobic and resistive training based on initial evaluation findings, risk stratification, comorbidities and participant's personal goals.       Expected Outcomes Short Term: Increase workloads from initial exercise prescription for resistance, speed, and METs.;Short Term: Perform resistance training exercises routinely during rehab and add in resistance training at home;Long Term: Improve cardiorespiratory fitness, muscular endurance and strength as measured by increased METs and functional capacity (6MWT)       Able to understand and  use rate of perceived exertion (RPE) scale Yes       Intervention Provide education and explanation on how to use RPE scale       Expected Outcomes Short Term: Able to use RPE daily in rehab to express subjective intensity level;Long Term:  Able to use RPE to guide intensity level when exercising independently       Able to understand and use Dyspnea scale Yes       Intervention Provide education and explanation on how to use Dyspnea scale       Expected Outcomes Short Term: Able to use Dyspnea scale daily in rehab to express subjective sense of shortness of breath during exertion;Long Term: Able to use Dyspnea scale to guide intensity level when exercising independently       Knowledge and understanding of Target Heart Rate Range (THRR) Yes       Intervention Provide education and  explanation of THRR including how the numbers were predicted and where they are located for reference       Expected Outcomes Short Term: Able to state/look up THRR;Long Term: Able to use THRR to govern intensity when exercising independently;Short Term: Able to use daily as guideline for intensity in rehab       Understanding of Exercise Prescription Yes       Intervention Provide education, explanation, and written materials on patient's individual exercise prescription       Expected Outcomes Short Term: Able to explain program exercise prescription;Long Term: Able to explain home exercise prescription to exercise independently              Exercise Goals Re-Evaluation :   Discharge Exercise Prescription (Final Exercise Prescription Changes):   Nutrition:  Target Goals: Understanding of nutrition guidelines, daily intake of sodium 1500mg , cholesterol 200mg , calories 30% from fat and 7% or less from saturated fats, daily to have 5 or more servings of fruits and vegetables.  Biometrics:  Pre Biometrics - 05/04/20 1018      Pre Biometrics   Grip Strength 18 kg            Nutrition Therapy Plan and Nutrition Goals:   Nutrition Assessments:  MEDIFICTS Score Key:  ?70 Need to make dietary changes   40-70 Heart Healthy Diet  ? 40 Therapeutic Level Cholesterol Diet   Picture Your Plate Scores:  <14 Unhealthy dietary pattern with much room for improvement.  41-50 Dietary pattern unlikely to meet recommendations for good health and room for improvement.  51-60 More healthful dietary pattern, with some room for improvement.   >60 Healthy dietary pattern, although there may be some specific behaviors that could be improved.    Nutrition Goals Re-Evaluation:   Nutrition Goals Discharge (Final Nutrition Goals Re-Evaluation):   Psychosocial: Target Goals: Acknowledge presence or absence of significant depression and/or stress, maximize coping skills, provide  positive support system. Participant is able to verbalize types and ability to use techniques and skills needed for reducing stress and depression.  Initial Review & Psychosocial Screening:  Initial Psych Review & Screening - 05/04/20 1019      Family Dynamics   Comments Widowed, husband passed away 60 years ago, but she has other family that is local and sees often      Screening Interventions   Expected Outcomes Long Term Goal: Stressors or current issues are controlled or eliminated.           Quality of Life Scores:  Scores of 19 and below usually indicate  a poorer quality of life in these areas.  A difference of  2-3 points is a clinically meaningful difference.  A difference of 2-3 points in the total score of the Quality of Life Index has been associated with significant improvement in overall quality of life, self-image, physical symptoms, and general health in studies assessing change in quality of life.  PHQ-9: Recent Review Flowsheet Data    Depression screen [facility] 2/9 05/04/2020   Decreased Interest 0   Down, Depressed, Hopeless 0   PHQ - 2 Score 0   Altered sleeping 0   Tired, decreased energy 0   Change in appetite 0   Feeling bad or failure about yourself  0   Trouble concentrating 0   Moving slowly or fidgety/restless 0   Suicidal thoughts 0   Difficult doing work/chores Not difficult at all     Interpretation of Total Score  Total Score Depression Severity:  1-4 = Minimal depression, 5-9 = Mild depression, 10-14 = Moderate depression, 15-19 = Moderately severe depression, 20-27 = Severe depression   Psychosocial Evaluation and Intervention:   Psychosocial Re-Evaluation:   Psychosocial Discharge (Final Psychosocial Re-Evaluation):   Education: Education Goals: Education classes will be provided on a weekly basis, covering required topics. Participant will state understanding/return demonstration of topics presented.  Learning Barriers/Preferences:   Learning Barriers/Preferences - 05/04/20 0923      Learning Barriers/Preferences   Learning Barriers None    Learning Preferences Written Material;Audio           Education Topics: Risk Factor Reduction:  -Group instruction that is supported by a PowerPoint presentation. Instructor discusses the definition of a risk factor, different risk factors for pulmonary disease, and how the heart and lungs work together.     Nutrition for Pulmonary Patient:  -Group instruction provided by PowerPoint slides, verbal discussion, and written materials to support subject matter. The instructor gives an explanation and review of healthy diet recommendations, which includes a discussion on weight management, recommendations for fruit and vegetable consumption, as well as protein, fluid, caffeine, fiber, sodium, sugar, and alcohol. Tips for eating when patients are short of breath are discussed.   Pursed Lip Breathing:  -Group instruction that is supported by demonstration and informational handouts. Instructor discusses the benefits of pursed lip and diaphragmatic breathing and detailed demonstration on how to preform both.     Oxygen Safety:  -Group instruction provided by PowerPoint, verbal discussion, and written material to support subject matter. There is an overview of "What is Oxygen" and "Why do we need it".  Instructor also reviews how to create a safe environment for oxygen use, the importance of using oxygen as prescribed, and the risks of noncompliance. There is a brief discussion on traveling with oxygen and resources the patient may utilize.   Oxygen Equipment:  -Group instruction provided by [facility] Staff utilizing handouts, written materials, and equipment demonstrations.   Signs and Symptoms:  -Group instruction provided by written material and verbal discussion to support subject matter. Warning signs and symptoms of infection, stroke, and heart attack are reviewed and when to  call the physician/911 reinforced. Tips for preventing the spread of infection discussed.   Advanced Directives:  -Group instruction provided by verbal instruction and written material to support subject matter. Instructor reviews Advanced Directive laws and proper instruction for filling out document.   Pulmonary Video:  -Group video education that reviews the importance of medication and oxygen compliance, exercise, good nutrition, pulmonary hygiene, and pursed lip and  diaphragmatic breathing for the pulmonary patient.   Exercise for the Pulmonary Patient:  -Group instruction that is supported by a PowerPoint presentation. Instructor discusses benefits of exercise, core components of exercise, frequency, duration, and intensity of an exercise routine, importance of utilizing pulse oximetry during exercise, safety while exercising, and options of places to exercise outside of rehab.     Pulmonary Medications:  -Verbally interactive group education provided by instructor with focus on inhaled medications and proper administration.   Anatomy and Physiology of the Respiratory System and Intimacy:  -Group instruction provided by PowerPoint, verbal discussion, and written material to support subject matter. Instructor reviews respiratory cycle and anatomical components of the respiratory system and their functions. Instructor also reviews differences in obstructive and restrictive respiratory diseases with examples of each. Intimacy, Sex, and Sexuality differences are reviewed with a discussion on how relationships can change when diagnosed with pulmonary disease. Common sexual concerns are reviewed.   MD DAY -A group question and answer session with a medical doctor that allows participants to ask questions that relate to their pulmonary disease state.   OTHER EDUCATION -Group or individual verbal, written, or video instructions that support the educational goals of the pulmonary rehab  program.   Holiday Eating Survival Tips:  -Group instruction provided by PowerPoint slides, verbal discussion, and written materials to support subject matter. The instructor gives patients tips, tricks, and techniques to help them not only survive but enjoy the holidays despite the onslaught of food that accompanies the holidays.   Knowledge Questionnaire Score:  Knowledge Questionnaire Score - 05/04/20 1025      Knowledge Questionnaire Score   Pre Score 16/18           Core Components/Risk Factors/Patient Goals at Admission:  Personal Goals and Risk Factors at Admission - 05/04/20 1020      Core Components/Risk Factors/Patient Goals on Admission    Weight Management Weight Loss    Improve shortness of breath with ADL's Yes    Intervention Provide education, individualized exercise plan and daily activity instruction to help decrease symptoms of SOB with activities of daily living.    Expected Outcomes Short Term: Improve cardiorespiratory fitness to achieve a reduction of symptoms when performing ADLs;Long Term: Be able to perform more ADLs without symptoms or delay the onset of symptoms    Hypertension Yes    Intervention Provide education on lifestyle modifcations including regular physical activity/exercise, weight management, moderate sodium restriction and increased consumption of fresh fruit, vegetables, and low fat dairy, alcohol moderation, and smoking cessation.    Expected Outcomes Long Term: Maintenance of blood pressure at goal levels.           Core Components/Risk Factors/Patient Goals Review:    Core Components/Risk Factors/Patient Goals at Discharge (Final Review):    ITP Comments:   Comments:

## 2020-05-08 ENCOUNTER — Other Ambulatory Visit: Payer: Self-pay

## 2020-05-08 ENCOUNTER — Encounter (HOSPITAL_COMMUNITY)
Admission: RE | Admit: 2020-05-08 | Discharge: 2020-05-08 | Disposition: A | Discharge status: Home / Self Care | Payer: Medicare Other | Source: Ambulatory Visit | Attending: Pulmonary Disease | Admitting: Pulmonary Disease

## 2020-05-08 VITALS — Wt 160.1 lb

## 2020-05-08 DIAGNOSIS — Z87891 Personal history of nicotine dependence: Secondary | ICD-10-CM | POA: Diagnosis not present

## 2020-05-08 DIAGNOSIS — J432 Centrilobular emphysema: Secondary | ICD-10-CM | POA: Diagnosis not present

## 2020-05-08 DIAGNOSIS — R0902 Hypoxemia: Secondary | ICD-10-CM | POA: Diagnosis not present

## 2020-05-08 NOTE — Progress Notes (Signed)
Pulmonary Individual Treatment Plan  Patient Details  Name: Heidi Ramirez MRN: [id number] Date of Birth: [birth year] Referring Provider:   April Manson Pulmonary Rehab Walk Test from 05/04/2020 in [place]  Referring Provider Dr. Elsworth Soho      Initial Encounter Date:  Flowsheet Row Pulmonary Rehab Walk Test from 05/04/2020 in [place]  Date 05/04/20      Visit Diagnosis: Centrilobular emphysema ([place])  Patient's Home Medications on Admission:   Current Outpatient Medications:  .  albuterol (VENTOLIN HFA) 108 (90 Base) MCG/ACT inhaler, Inhale 2 puffs into the lungs every 6 (six) hours as needed for wheezing or shortness of breath., Disp: 8 g, Rfl: 1 .  amLODipine (NORVASC) 5 MG tablet, Take 1 tablet (5 mg total) by mouth daily., Disp: 30 tablet, Rfl: 0 .  Fluticasone-Umeclidin-Vilant (TRELEGY ELLIPTA) 200-62.5-25 MCG/INH AEPB, Inhale 1 puff into the lungs daily., Disp: 60 each, Rfl: 6 .  Spacer/Aero-Holding Chambers (AEROCHAMBER MV) inhaler, Use as instructed (Patient taking differently: 1 each by Other route as directed. ), Disp: 1 each, Rfl: 0  Past Medical History: Past Medical History:  Diagnosis Date  . Heart murmur    since birth  . HTN (hypertension)   . 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 Packs/day: 0.25  . Years: 59.00  . Pack years: 14.75  . Types: Cigarettes  . Start date: 1961  Smokeless Tobacco Never Used  Tobacco Comment   Quit end of july 2021    Labs: Recent Review Flowsheet Data   There is no flowsheet data to display.     Capillary Blood Glucose: No results found for: GLUCAP   Pulmonary Assessment Scores:  Pulmonary Assessment Scores    Row Name 05/04/20 1026         ADL UCSD   ADL Phase Entry     SOB Score total 4           CAT Score   CAT Score 4           mMRC Score   mMRC Score 1            UCSD: Self-administered rating of dyspnea associated with activities of daily living (ADLs) 6-point scale (0 = "not at all" to 5 = "maximal or unable to do because of breathlessness")  Scoring Scores range from 0 to 120.  Minimally important difference is 5 units  CAT: CAT can identify the health impairment of COPD patients and is better correlated with disease progression.  CAT has a scoring range of zero to 40. The CAT score is classified into four groups of low (less than 10), medium (10 - 20), high (21-30) and very high (31-40) based on the impact level of disease on health status. A CAT score over 10 suggests significant symptoms.  A worsening CAT score could be explained by an exacerbation, poor medication adherence, poor inhaler technique, or progression of COPD or comorbid conditions.  CAT MCID is 2 points  mMRC: mMRC (Modified Medical Research Council) Dyspnea Scale is used to assess the degree of baseline functional disability in patients of respiratory disease due to dyspnea. No minimal important difference is established. A decrease in score of 1 point or greater is considered a positive change.   Pulmonary Function Assessment:  Pulmonary Function Assessment - 05/04/20 0920      Breath   Shortness  of Breath Yes;Limiting activity           Exercise Target Goals: Exercise Program Goal: Individual exercise prescription set using results from initial 6 min walk test and THRR while considering  patient's activity barriers and safety.   Exercise Prescription Goal: Initial exercise prescription builds to 30-45 minutes a day of aerobic activity, 2-3 days per week.  Home exercise guidelines will be given to patient during program as part of exercise prescription that the participant will acknowledge.  Activity Barriers & Risk Stratification:  Activity Barriers & Cardiac Risk Stratification - 05/04/20 0919      Activity Barriers & Cardiac Risk Stratification   Activity  Barriers Shortness of Breath;Deconditioning    Cardiac Risk Stratification Low           6 Minute Walk:  6 Minute Walk    Row Name 05/04/20 1230         6 Minute Walk   Phase Initial     Distance 1393 feet     Walk Time 6 minutes     # of Rest Breaks 0     MPH 2.64     METS 3.64     RPE 9     Perceived Dyspnea  1     VO2 Peak 12.74     Symptoms No     Resting HR 78 bpm     Resting BP 142/72     Resting Oxygen Saturation  95 %     Exercise Oxygen Saturation  during 6 min walk 92 %     Max Ex. HR 109 bpm     Max Ex. BP 172/60     2 Minute Post BP 150/64           Interval HR   1 Minute HR 104     2 Minute HR 102     3 Minute HR 109     4 Minute HR 107     5 Minute HR 106     6 Minute HR 90     2 Minute Post HR 81     Interval Heart Rate? Yes           Interval Oxygen   Interval Oxygen? Yes     Baseline Oxygen Saturation % 95 %     1 Minute Oxygen Saturation % 93 %     1 Minute Liters of Oxygen 0 L     2 Minute Oxygen Saturation % 92 %     2 Minute Liters of Oxygen 0 L     3 Minute Oxygen Saturation % 93 %     3 Minute Liters of Oxygen 0 L     4 Minute Oxygen Saturation % 94 %     4 Minute Liters of Oxygen 0 L     5 Minute Oxygen Saturation % 94 %     5 Minute Liters of Oxygen 0 L     6 Minute Oxygen Saturation % 95 %     6 Minute Liters of Oxygen 0 L     2 Minute Post Oxygen Saturation % 97 %     2 Minute Post Liters of Oxygen 0 L            Oxygen Initial Assessment:  Oxygen Initial Assessment - 05/04/20 0920      Home Oxygen   Home Oxygen Device None    Sleep Oxygen Prescription None    Home Exercise Oxygen Prescription None  Home Resting Oxygen Prescription None      Initial 6 min Walk   Oxygen Used None      Program Oxygen Prescription   Program Oxygen Prescription None      Intervention   Short Term Goals To learn and understand importance of monitoring SPO2 with pulse oximeter and demonstrate accurate use of the pulse  oximeter.;To learn and understand importance of maintaining oxygen saturations>88%;To learn and demonstrate proper pursed lip breathing techniques or other breathing techniques.;To learn and demonstrate proper use of respiratory medications    Long  Term Goals Verbalizes importance of monitoring SPO2 with pulse oximeter and return demonstration;Maintenance of O2 saturations>88%;Exhibits proper breathing techniques, such as pursed lip breathing or other method taught during program session;Compliance with respiratory medication;Demonstrates proper use of MDI's           Oxygen Re-Evaluation:  Oxygen Re-Evaluation    Row Name 05/08/20 0919             Program Oxygen Prescription   Program Oxygen Prescription None               Home Oxygen   Home Oxygen Device None       Sleep Oxygen Prescription None       Home Exercise Oxygen Prescription None       Home Resting Oxygen Prescription None               Goals/Expected Outcomes   Short Term Goals To learn and understand importance of monitoring SPO2 with pulse oximeter and demonstrate accurate use of the pulse oximeter.;To learn and understand importance of maintaining oxygen saturations>88%;To learn and demonstrate proper pursed lip breathing techniques or other breathing techniques.;To learn and demonstrate proper use of respiratory medications       Long  Term Goals Verbalizes importance of monitoring SPO2 with pulse oximeter and return demonstration;Maintenance of O2 saturations>88%;Exhibits proper breathing techniques, such as pursed lip breathing or other method taught during program session;Compliance with respiratory medication;Demonstrates proper use of MDI's       Goals/Expected Outcomes Compliance and understanding of oxygen saturation and pursed lip breathing.              Oxygen Discharge (Final Oxygen Re-Evaluation):  Oxygen Re-Evaluation - 05/08/20 0919      Program Oxygen Prescription   Program Oxygen Prescription  None      Home Oxygen   Home Oxygen Device None    Sleep Oxygen Prescription None    Home Exercise Oxygen Prescription None    Home Resting Oxygen Prescription None      Goals/Expected Outcomes   Short Term Goals To learn and understand importance of monitoring SPO2 with pulse oximeter and demonstrate accurate use of the pulse oximeter.;To learn and understand importance of maintaining oxygen saturations>88%;To learn and demonstrate proper pursed lip breathing techniques or other breathing techniques.;To learn and demonstrate proper use of respiratory medications    Long  Term Goals Verbalizes importance of monitoring SPO2 with pulse oximeter and return demonstration;Maintenance of O2 saturations>88%;Exhibits proper breathing techniques, such as pursed lip breathing or other method taught during program session;Compliance with respiratory medication;Demonstrates proper use of MDI's    Goals/Expected Outcomes Compliance and understanding of oxygen saturation and pursed lip breathing.           Initial Exercise Prescription:  Initial Exercise Prescription - 05/04/20 1400      Date of Initial Exercise RX and Referring Provider   Date 05/04/20    Referring Provider Dr. Elsworth Soho  Expected Discharge Date 07/05/20      Treadmill   MPH 2.2    Grade 0    Minutes 15      NuStep   Level 2    SPM 80    Minutes 15      Prescription Details   Frequency (times per week) 2    Duration Progress to 45 minutes of aerobic exercise without signs/symptoms of physical distress      Intensity   THRR 40-80% of Max Heartrate 61-122    Ratings of Perceived Exertion 11-13    Perceived Dyspnea 0-4      Progression   Progression Continue to progress workloads to maintain intensity without signs/symptoms of physical distress.      Resistance Training   Training Prescription Yes    Weight Blue bands    Reps 10-15           Perform Capillary Blood Glucose checks as needed.  Exercise  Prescription Changes:   Exercise Comments:   Exercise Goals and Review:  Exercise Goals    Row Name 05/04/20 1017             Exercise Goals   Increase Physical Activity Yes       Intervention Provide advice, education, support and counseling about physical activity/exercise needs.;Develop an individualized exercise prescription for aerobic and resistive training based on initial evaluation findings, risk stratification, comorbidities and participant's personal goals.       Expected Outcomes Short Term: Attend rehab on a regular basis to increase amount of physical activity.;Long Term: Add in home exercise to make exercise part of routine and to increase amount of physical activity.;Long Term: Exercising regularly at least 3-5 days a week.       Increase Strength and Stamina Yes       Intervention Provide advice, education, support and counseling about physical activity/exercise needs.;Develop an individualized exercise prescription for aerobic and resistive training based on initial evaluation findings, risk stratification, comorbidities and participant's personal goals.       Expected Outcomes Short Term: Increase workloads from initial exercise prescription for resistance, speed, and METs.;Short Term: Perform resistance training exercises routinely during rehab and add in resistance training at home;Long Term: Improve cardiorespiratory fitness, muscular endurance and strength as measured by increased METs and functional capacity (6MWT)       Able to understand and use rate of perceived exertion (RPE) scale Yes       Intervention Provide education and explanation on how to use RPE scale       Expected Outcomes Short Term: Able to use RPE daily in rehab to express subjective intensity level;Long Term:  Able to use RPE to guide intensity level when exercising independently       Able to understand and use Dyspnea scale Yes       Intervention Provide education and explanation on how to use  Dyspnea scale       Expected Outcomes Short Term: Able to use Dyspnea scale daily in rehab to express subjective sense of shortness of breath during exertion;Long Term: Able to use Dyspnea scale to guide intensity level when exercising independently       Knowledge and understanding of Target Heart Rate Range (THRR) Yes       Intervention Provide education and explanation of THRR including how the numbers were predicted and where they are located for reference       Expected Outcomes Short Term: Able to state/look up THRR;Long Term: Able to  use THRR to govern intensity when exercising independently;Short Term: Able to use daily as guideline for intensity in rehab       Understanding of Exercise Prescription Yes       Intervention Provide education, explanation, and written materials on patient's individual exercise prescription       Expected Outcomes Short Term: Able to explain program exercise prescription;Long Term: Able to explain home exercise prescription to exercise independently              Exercise Goals Re-Evaluation :  Exercise Goals Re-Evaluation    Row Name 05/08/20 0919             Exercise Goal Re-Evaluation   Exercise Goals Review Increase Physical Activity;Increase Strength and Stamina;Able to understand and use rate of perceived exertion (RPE) scale;Able to understand and use Dyspnea scale;Knowledge and understanding of Target Heart Rate Range (THRR);Understanding of Exercise Prescription       Comments Patient's first day of exercise is today. It is too early to see any progression. Will continue to monitor and progress as she is able.       Expected Outcomes Through exercise at rehab and home the patient will decrease shortness of breath with daily activities and feel confident in carrying out an exercise regimn at home.              Discharge Exercise Prescription (Final Exercise Prescription Changes):   Nutrition:  Target Goals: Understanding of nutrition  guidelines, daily intake of sodium 1500mg , cholesterol 200mg , calories 30% from fat and 7% or less from saturated fats, daily to have 5 or more servings of fruits and vegetables.  Biometrics:  Pre Biometrics - 05/04/20 1018      Pre Biometrics   Grip Strength 18 kg            Nutrition Therapy Plan and Nutrition Goals:   Nutrition Assessments:  MEDIFICTS Score Key:  ?70 Need to make dietary changes   40-70 Heart Healthy Diet  ? 40 Therapeutic Level Cholesterol Diet   Picture Your Plate Scores:  <36 Unhealthy dietary pattern with much room for improvement.  41-50 Dietary pattern unlikely to meet recommendations for good health and room for improvement.  51-60 More healthful dietary pattern, with some room for improvement.   >60 Healthy dietary pattern, although there may be some specific behaviors that could be improved.    Nutrition Goals Re-Evaluation:   Nutrition Goals Discharge (Final Nutrition Goals Re-Evaluation):   Psychosocial: Target Goals: Acknowledge presence or absence of significant depression and/or stress, maximize coping skills, provide positive support system. Participant is able to verbalize types and ability to use techniques and skills needed for reducing stress and depression.  Initial Review & Psychosocial Screening:  Initial Psych Review & Screening - 05/04/20 1019      Family Dynamics   Comments Widowed, husband passed away 51 years ago, but she has other family that is local and sees often      Screening Interventions   Expected Outcomes Long Term Goal: Stressors or current issues are controlled or eliminated.           Quality of Life Scores:  Scores of 19 and below usually indicate a poorer quality of life in these areas.  A difference of  2-3 points is a clinically meaningful difference.  A difference of 2-3 points in the total score of the Quality of Life Index has been associated with significant improvement in overall  quality of life, self-image, physical symptoms,  and general health in studies assessing change in quality of life.  PHQ-9: Recent Review Flowsheet Data    Depression screen [facility] 2/9 05/04/2020   Decreased Interest 0   Down, Depressed, Hopeless 0   PHQ - 2 Score 0   Altered sleeping 0   Tired, decreased energy 0   Change in appetite 0   Feeling bad or failure about yourself  0   Trouble concentrating 0   Moving slowly or fidgety/restless 0   Suicidal thoughts 0   Difficult doing work/chores Not difficult at all     Interpretation of Total Score  Total Score Depression Severity:  1-4 = Minimal depression, 5-9 = Mild depression, 10-14 = Moderate depression, 15-19 = Moderately severe depression, 20-27 = Severe depression   Psychosocial Evaluation and Intervention:   Psychosocial Re-Evaluation:  Psychosocial Re-Evaluation    [place] Name 05/07/20 1214 05/07/20 1215           Psychosocial Re-Evaluation   Current issues with Current Stress Concerns Current Stress Concerns      Comments -- Patricie will begin exercising in pulmonary rehab tomorrow, no change in psychosocial status since orientation.      Expected Outcomes -- For Stepfanie to handle her stress in healthy manners.      Interventions -- Stress management education;Encouraged to attend Pulmonary Rehabilitation for the exercise      Continue Psychosocial Services  -- No Follow up required             Psychosocial Discharge (Final Psychosocial Re-Evaluation):  Psychosocial Re-Evaluation - 05/07/20 1215      Psychosocial Re-Evaluation   Current issues with Current Stress Concerns    Comments Klani will begin exercising in pulmonary rehab tomorrow, no change in psychosocial status since orientation.    Expected Outcomes For Tamla to handle her stress in healthy manners.    Interventions Stress management education;Encouraged to attend Pulmonary Rehabilitation for the exercise    Continue Psychosocial Services  No Follow up  required           Education: Education Goals: Education classes will be provided on a weekly basis, covering required topics. Participant will state understanding/return demonstration of topics presented.  Learning Barriers/Preferences:  Learning Barriers/Preferences - 05/04/20 0923      Learning Barriers/Preferences   Learning Barriers None    Learning Preferences Written Material;Audio           Education Topics: Risk Factor Reduction:  -Group instruction that is supported by a PowerPoint presentation. Instructor discusses the definition of a risk factor, different risk factors for pulmonary disease, and how the heart and lungs work together.     Nutrition for Pulmonary Patient:  -Group instruction provided by PowerPoint slides, verbal discussion, and written materials to support subject matter. The instructor gives an explanation and review of healthy diet recommendations, which includes a discussion on weight management, recommendations for fruit and vegetable consumption, as well as protein, fluid, caffeine, fiber, sodium, sugar, and alcohol. Tips for eating when patients are short of breath are discussed.   Pursed Lip Breathing:  -Group instruction that is supported by demonstration and informational handouts. Instructor discusses the benefits of pursed lip and diaphragmatic breathing and detailed demonstration on how to preform both.     Oxygen Safety:  -Group instruction provided by PowerPoint, verbal discussion, and written material to support subject matter. There is an overview of "What is Oxygen" and "Why do we need it".  Instructor also reviews how to create a safe environment  for oxygen use, the importance of using oxygen as prescribed, and the risks of noncompliance. There is a brief discussion on traveling with oxygen and resources the patient may utilize.   Oxygen Equipment:  -Group instruction provided by [facility] Staff utilizing handouts, written  materials, and equipment demonstrations.   Signs and Symptoms:  -Group instruction provided by written material and verbal discussion to support subject matter. Warning signs and symptoms of infection, stroke, and heart attack are reviewed and when to call the physician/911 reinforced. Tips for preventing the spread of infection discussed.   Advanced Directives:  -Group instruction provided by verbal instruction and written material to support subject matter. Instructor reviews Advanced Directive laws and proper instruction for filling out document.   Pulmonary Video:  -Group video education that reviews the importance of medication and oxygen compliance, exercise, good nutrition, pulmonary hygiene, and pursed lip and diaphragmatic breathing for the pulmonary patient.   Exercise for the Pulmonary Patient:  -Group instruction that is supported by a PowerPoint presentation. Instructor discusses benefits of exercise, core components of exercise, frequency, duration, and intensity of an exercise routine, importance of utilizing pulse oximetry during exercise, safety while exercising, and options of places to exercise outside of rehab.     Pulmonary Medications:  -Verbally interactive group education provided by instructor with focus on inhaled medications and proper administration.   Anatomy and Physiology of the Respiratory System and Intimacy:  -Group instruction provided by PowerPoint, verbal discussion, and written material to support subject matter. Instructor reviews respiratory cycle and anatomical components of the respiratory system and their functions. Instructor also reviews differences in obstructive and restrictive respiratory diseases with examples of each. Intimacy, Sex, and Sexuality differences are reviewed with a discussion on how relationships can change when diagnosed with pulmonary disease. Common sexual concerns are reviewed.   MD DAY -A group question and answer session  with a medical doctor that allows participants to ask questions that relate to their pulmonary disease state.   OTHER EDUCATION -Group or individual verbal, written, or video instructions that support the educational goals of the pulmonary rehab program.   Holiday Eating Survival Tips:  -Group instruction provided by PowerPoint slides, verbal discussion, and written materials to support subject matter. The instructor gives patients tips, tricks, and techniques to help them not only survive but enjoy the holidays despite the onslaught of food that accompanies the holidays.   Knowledge Questionnaire Score:  Knowledge Questionnaire Score - 05/04/20 1025      Knowledge Questionnaire Score   Pre Score 16/18           Core Components/Risk Factors/Patient Goals at Admission:  Personal Goals and Risk Factors at Admission - 05/04/20 1020      Core Components/Risk Factors/Patient Goals on Admission    Weight Management Weight Loss    Improve shortness of breath with ADL's Yes    Intervention Provide education, individualized exercise plan and daily activity instruction to help decrease symptoms of SOB with activities of daily living.    Expected Outcomes Short Term: Improve cardiorespiratory fitness to achieve a reduction of symptoms when performing ADLs;Long Term: Be able to perform more ADLs without symptoms or delay the onset of symptoms    Hypertension Yes    Intervention Provide education on lifestyle modifcations including regular physical activity/exercise, weight management, moderate sodium restriction and increased consumption of fresh fruit, vegetables, and low fat dairy, alcohol moderation, and smoking cessation.    Expected Outcomes Long Term: Maintenance of blood pressure at  goal levels.           Core Components/Risk Factors/Patient Goals Review:   Goals and Risk Factor Review    Row Name 05/07/20 1217             Core Components/Risk Factors/Patient Goals Review    Personal Goals Review Increase knowledge of respiratory medications and ability to use respiratory devices properly.;Improve shortness of breath with ADL's;Develop more efficient breathing techniques such as purse lipped breathing and diaphragmatic breathing and practicing self-pacing with activity.       Review Cyleigh will begin exercising in pulmonary rehab tomorrow, 05/08/2020.  We will assist Cacey in meeting these goals.       Expected Outcomes See admission goals.              Core Components/Risk Factors/Patient Goals at Discharge (Final Review):   Goals and Risk Factor Review - 05/07/20 1217      Core Components/Risk Factors/Patient Goals Review   Personal Goals Review Increase knowledge of respiratory medications and ability to use respiratory devices properly.;Improve shortness of breath with ADL's;Develop more efficient breathing techniques such as purse lipped breathing and diaphragmatic breathing and practicing self-pacing with activity.    Review Lene will begin exercising in pulmonary rehab tomorrow, 05/08/2020.  We will assist Lashena in meeting these goals.    Expected Outcomes See admission goals.           ITP Comments:   Comments: ITP REVIEW Pt is making expected progress toward pulmonary rehab goals after completing 0 sessions. Recommend continued exercise, life style modification, education, and utilization of breathing techniques to increase stamina and strength and decrease shortness of breath with exertion.

## 2020-05-08 NOTE — Progress Notes (Signed)
Daily Session Note  Patient Details  Name: Heidi Ramirez MRN: [id number] Date of Birth: 12/27/1950 Referring Provider:   Flowsheet Row Pulmonary Rehab Walk Test from 05/04/2020 in [facility]  Referring Provider Dr. Alva      Encounter Date: 05/08/2020  Check In:  Session Check In - 05/08/20 1112      Check-In   Supervising physician immediately available to respond to emergencies [facility] immediately available    Physician(s) Dr. Rai    Location [facility]    Staff Present Lisa Hughes, RN;Joan Behrens, RN, BSN;Jessica Martin, MS, ACSM-CEP, Exercise Physiologist    Virtual Visit No    Medication changes reported     No    Fall or balance concerns reported    No    Tobacco Cessation No Change    Warm-up and Cool-down Performed on first and last piece of equipment    Resistance Training Performed Yes    VAD Patient? No    PAD/SET Patient? No      Pain Assessment   Currently in Pain? No/denies    Multiple Pain Sites No           Capillary Blood Glucose: No results found for this or any previous visit (from the past 24 hour(s)).   Exercise Prescription Changes - 05/08/20 1100      Response to Exercise   Blood Pressure (Admit) 152/70    Blood Pressure (Exercise) 130/62    Blood Pressure (Exit) 142/52    Heart Rate (Admit) 79 bpm    Heart Rate (Exercise) 111 bpm    Heart Rate (Exit) 84 bpm    Oxygen Saturation (Admit) 99 %    Oxygen Saturation (Exercise) 93 %    Oxygen Saturation (Exit) 95 %    Rating of Perceived Exertion (Exercise) 11    Perceived Dyspnea (Exercise) 0    Duration Continue with 30 min of aerobic exercise without signs/symptoms of physical distress.    Intensity Other (comment)   40-80% of HRR     Progression   Progression Continue to progress workloads to maintain intensity without signs/symptoms of physical distress.      Resistance Training   Training Prescription Yes    Weight blue  bands    Reps 10-15    Time 10 Minutes      Treadmill   MPH 1.6    Grade 0    Minutes 15      NuStep   Level 2    SPM 80    Minutes 15    METs 2.1           Social History   Tobacco Use  Smoking Status Former Smoker  . Packs/day: 0.25  . Years: 59.00  . Pack years: 14.75  . Types: Cigarettes  . Start date: 1961  Smokeless Tobacco Never Used  Tobacco Comment   Quit end of july 2021    Goals Met:  Exercise tolerated well No report of cardiac concerns or symptoms Strength training completed today  Goals Unmet:  Not Applicable  Comments: Service time is from 1015 to 1130    Dr. Traci Turner is Medical Director for Cardiac Rehab at  Hospital. 

## 2020-05-10 ENCOUNTER — Encounter (HOSPITAL_COMMUNITY)
Admission: RE | Admit: 2020-05-10 | Discharge: 2020-05-10 | Disposition: A | Discharge status: Home / Self Care | Payer: Medicare Other | Source: Ambulatory Visit | Attending: Pulmonary Disease | Admitting: Pulmonary Disease

## 2020-05-10 ENCOUNTER — Other Ambulatory Visit: Payer: Self-pay

## 2020-05-10 DIAGNOSIS — J432 Centrilobular emphysema: Secondary | ICD-10-CM

## 2020-05-10 DIAGNOSIS — R0902 Hypoxemia: Secondary | ICD-10-CM | POA: Diagnosis not present

## 2020-05-10 DIAGNOSIS — Z87891 Personal history of nicotine dependence: Secondary | ICD-10-CM | POA: Diagnosis not present

## 2020-05-10 NOTE — Progress Notes (Signed)
Daily Session Note  Patient Details  Name: Heidi Ramirez MRN: [id number] Date of Birth: 03-Dec-1950 Referring Provider:   April Manson Pulmonary Rehab Walk Test from 05/04/2020 in [place]  Referring Provider Dr. Elsworth Soho      Encounter Date: 05/10/2020  Check In:  Session Check In - 05/10/20 1113      Check-In   Supervising physician immediately available to respond to emergencies [facility] immediately available    Physician(s) Dr. Tana Coast    [facility]    Staff Present Rosebud Poles, RN, BSN;Maryfrances Portugal Ysidro Evert, RN;Jessica Hassell Done, MS, ACSM-CEP, Exercise Physiologist    Virtual Visit No    Medication changes reported     No    Fall or balance concerns reported    No    Tobacco Cessation No Change    Warm-up and Cool-down Performed on first and last piece of equipment    Resistance Training Performed Yes    VAD Patient? No    PAD/SET Patient? No      Pain Assessment   Currently in Pain? No/denies    Multiple Pain Sites No           Capillary Blood Glucose: No results found for this or any previous visit (from the past 24 hour(s)).    Social History   Tobacco Use  Smoking Status Former Smoker  . Packs/day: 0.25  . Years: 59.00  . Pack years: 14.75  . Types: Cigarettes  . Start date: 1961  Smokeless Tobacco Never Used  Tobacco Comment   Quit end of july 2021    Goals Met:  Exercise tolerated well No report of cardiac concerns or symptoms Strength training completed today  Goals Unmet:  Not Applicable  Comments: Service time is from 1015 to 1122    Dr. Fransico Him is Medical Director for Cardiac Rehab at [facility].

## 2020-05-15 ENCOUNTER — Encounter (HOSPITAL_COMMUNITY)
Admission: RE | Admit: 2020-05-15 | Discharge: 2020-05-15 | Disposition: A | Discharge status: Home / Self Care | Payer: Medicare Other | Source: Ambulatory Visit | Attending: Pulmonary Disease | Admitting: Pulmonary Disease

## 2020-05-15 ENCOUNTER — Other Ambulatory Visit: Payer: Self-pay

## 2020-05-15 DIAGNOSIS — J432 Centrilobular emphysema: Secondary | ICD-10-CM

## 2020-05-15 DIAGNOSIS — R0902 Hypoxemia: Secondary | ICD-10-CM | POA: Diagnosis not present

## 2020-05-15 DIAGNOSIS — Z87891 Personal history of nicotine dependence: Secondary | ICD-10-CM | POA: Diagnosis not present

## 2020-05-15 NOTE — Progress Notes (Signed)
Daily Session Note  Patient Details  Name: BESAN KETCHEM MRN: [id number] Date of Birth: 04-07-1950 Referring Provider:   April Manson Pulmonary Rehab Walk Test from 05/04/2020 in [place]  Referring Provider Dr. Elsworth Soho      Encounter Date: 05/15/2020  Check In:  Session Check In - 05/15/20 1032      Check-In   Supervising physician immediately available to respond to emergencies [facility] immediately available    Physician(s) Dr. Tana Coast    [facility]    Staff Present Rosebud Poles, RN, BSN;Jerrico Covello Ysidro Evert, RN;Jessica Hassell Done, MS, ACSM-CEP, Exercise Physiologist    Virtual Visit No    Medication changes reported     No    Fall or balance concerns reported    No    Tobacco Cessation No Change    Warm-up and Cool-down Performed on first and last piece of equipment    Resistance Training Performed Yes    VAD Patient? No    PAD/SET Patient? No      Pain Assessment   Currently in Pain? No/denies    Multiple Pain Sites No           Capillary Blood Glucose: No results found for this or any previous visit (from the past 24 hour(s)).    Social History   Tobacco Use  Smoking Status Former Smoker  . Packs/day: 0.25  . Years: 59.00  . Pack years: 14.75  . Types: Cigarettes  . Start date: 1961  Smokeless Tobacco Never Used  Tobacco Comment   Quit end of july 2021    Goals Met:  Exercise tolerated well No report of cardiac concerns or symptoms Strength training completed today  Goals Unmet:  Not Applicable  Comments: Service time is from 1010 to 1120    Dr. Fransico Him is Medical Director for Cardiac Rehab at [facility].

## 2020-05-17 ENCOUNTER — Other Ambulatory Visit: Payer: Self-pay

## 2020-05-17 ENCOUNTER — Encounter (HOSPITAL_COMMUNITY)
Admission: RE | Admit: 2020-05-17 | Discharge: 2020-05-17 | Disposition: A | Discharge status: Home / Self Care | Payer: Medicare Other | Source: Ambulatory Visit | Attending: Pulmonary Disease | Admitting: Pulmonary Disease

## 2020-05-17 VITALS — Wt 160.0 lb

## 2020-05-17 DIAGNOSIS — J432 Centrilobular emphysema: Secondary | ICD-10-CM

## 2020-05-17 DIAGNOSIS — R0902 Hypoxemia: Secondary | ICD-10-CM | POA: Diagnosis not present

## 2020-05-17 DIAGNOSIS — Z87891 Personal history of nicotine dependence: Secondary | ICD-10-CM | POA: Diagnosis not present

## 2020-05-17 NOTE — Progress Notes (Addendum)
Heidi Ramirez 70 y.o. female Nutrition Note  Visit Diagnosis: Centrilobular emphysema ([place])  Past Medical History:  Diagnosis Date  . Heart murmur    since birth  . HTN (hypertension)   . 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   Current Outpatient Medications:  .  albuterol (VENTOLIN HFA) 108 (90 Base) MCG/ACT inhaler, Inhale 2 puffs into the lungs every 6 (six) hours as needed for wheezing or shortness of breath., Disp: 8 g, Rfl: 1 .  amLODipine (NORVASC) 5 MG tablet, Take 1 tablet (5 mg total) by mouth daily., Disp: 30 tablet, Rfl: 0 .  Fluticasone-Umeclidin-Vilant (TRELEGY ELLIPTA) 200-62.5-25 MCG/INH AEPB, Inhale 1 puff into the lungs daily., Disp: 60 each, Rfl: 6 .  Spacer/Aero-Holding Chambers (AEROCHAMBER MV) inhaler, Use as instructed (Patient taking differently: 1 each by Other route as directed. ), Disp: 1 each, Rfl: 0   Ht Readings from Last 1 Encounters:  05/04/20 5\' 6"  (1.676 m)     Wt Readings from Last 3 Encounters:  05/08/20 160 lb 0.9 oz (72.6 kg)  05/04/20 162 lb 14.7 oz (73.9 kg)  03/01/20 163 lb (73.9 kg)     There is no height or weight on file to calculate BMI.   Social History   Tobacco Use  Smoking Status Former Smoker  . Packs/day: 0.25  . Years: 59.00  . Pack years: 14.75  . Types: Cigarettes  . Start date: 1961  Smokeless Tobacco Never Used  Tobacco Comment   Quit end of july 2021      Nutrition Note  Spoke with pt. Nutrition Plan and Nutrition Survey goals reviewed with pt.   Pt states goal of weight loss. Reviewed weight history and dieting history. She has done the next 56 day diet plan but no other dieting. She lost 10 lbs and gained it back on that diet.  She discussed pros and cons of diet. She feels her meals are pretty balanced but her snacks are poor and she typically snacks out of boredom at night. She has good social support. She does not eat out often (1x/week) She drinks sweet tea for lunch and dinner.  Overall fluid intake is poor. She wants to increase water intake. Encourage pt to carry water bottle.  Reviewed energy balance.   Reviewed exercise hx. She used to do yoga at home and enjoyed that. She is not interested in gyms. She does like to walk in stores. She is participating in pulmonary rehab 2 days per week. She will start incorporating yoga at home again.  Pt expressed understanding of the information reviewed.    Nutrition Diagnosis ? Overweight  related to excessive energy intake and sedentary as evidenced by a pt report of 25 lb weight gain over the past 10 years, very little exercise, and pt report of large snacks/mindless eating at night  Nutrition Intervention ? Pt's individual nutrition plan reviewed with pt. ? Meal and snack changes  ? Continue client-centered nutrition education by RD, as part of interdisciplinary care.  Goal(s) Pt to identify food quantities necessary to achieve weight loss of 4-8 lb at graduation from pulmonary rehab.  ? Pt to swap out processed snacks for more healthful snacks  Plan:   Will provide client-centered nutrition education as part of interdisciplinary care  Monitor and evaluate progress toward nutrition goal with team.   Michaele Offer, MS, RDN, LDN

## 2020-05-17 NOTE — Progress Notes (Signed)
Daily Session Note  Patient Details  Name: Heidi Ramirez MRN: [id number] Date of Birth: 01-Sep-1950 Referring Provider:   April Manson Pulmonary Rehab Walk Test from 05/04/2020 in [place]  Referring Provider Dr. Elsworth Soho      Encounter Date: 05/17/2020  Check In:  Session Check In - 05/17/20 1035      Check-In   Supervising physician immediately available to respond to emergencies [facility] immediately available    Physician(s) Dr. Sloan Leiter    [facility]    Staff Present Rosebud Poles, RN, BSN;Lisa Ysidro Evert, RN;Bridgid Printz Hassell Done, MS, ACSM-CEP, Exercise Physiologist    Virtual Visit No    Medication changes reported     No    Fall or balance concerns reported    No    Tobacco Cessation No Change    Warm-up and Cool-down Performed on first and last piece of equipment    Resistance Training Performed Yes    VAD Patient? No    PAD/SET Patient? No      Pain Assessment   Currently in Pain? No/denies    Multiple Pain Sites No           Capillary Blood Glucose: No results found for this or any previous visit (from the past 24 hour(s)).    Social History   Tobacco Use  Smoking Status Former Smoker  . Packs/day: 0.25  . Years: 59.00  . Pack years: 14.75  . Types: Cigarettes  . Start date: 1961  Smokeless Tobacco Never Used  Tobacco Comment   Quit end of july 2021    Goals Met:  Proper associated with RPD/PD & O2 Sat Independence with exercise equipment Exercise tolerated well Strength training completed today  Goals Unmet:  Not Applicable  Comments: Service time is from 1020 to 1130    Dr. Fransico Him is Medical Director for Cardiac Rehab at [facility].

## 2020-05-22 ENCOUNTER — Encounter (HOSPITAL_COMMUNITY)
Admission: RE | Admit: 2020-05-22 | Discharge: 2020-05-22 | Disposition: A | Discharge status: Home / Self Care | Payer: Medicare Other | Source: Ambulatory Visit | Attending: Pulmonary Disease | Admitting: Pulmonary Disease

## 2020-05-22 ENCOUNTER — Other Ambulatory Visit: Payer: Self-pay

## 2020-05-22 DIAGNOSIS — J432 Centrilobular emphysema: Secondary | ICD-10-CM

## 2020-05-22 DIAGNOSIS — R0902 Hypoxemia: Secondary | ICD-10-CM | POA: Diagnosis not present

## 2020-05-22 DIAGNOSIS — Z87891 Personal history of nicotine dependence: Secondary | ICD-10-CM | POA: Diagnosis not present

## 2020-05-22 NOTE — Progress Notes (Signed)
Daily Session Note  Patient Details  Name: Heidi Ramirez MRN: [id number] Date of Birth: 01/05/1951 Referring Provider:   April Manson Pulmonary Rehab Walk Test from 05/04/2020 in [place]  Referring Provider Dr. Elsworth Soho      Encounter Date: 05/22/2020  Check In:  Session Check In - 05/22/20 1109      Check-In   Supervising physician immediately available to respond to emergencies [facility] immediately available    Physician(s) Dr. Sloan Leiter    [facility]    Staff Present Rosebud Poles, RN, Isaac Laud, MS, ACSM-CEP, Exercise Physiologist;Lisa Ysidro Evert, RN    Virtual Visit No    Medication changes reported     No    Fall or balance concerns reported    No    Tobacco Cessation No Change    Warm-up and Cool-down Performed on first and last piece of equipment    Resistance Training Performed Yes    VAD Patient? No    PAD/SET Patient? No      Pain Assessment   Currently in Pain? No/denies    Multiple Pain Sites No           Capillary Blood Glucose: No results found for this or any previous visit (from the past 24 hour(s)).   Exercise Prescription Changes - 05/22/20 1100      Response to Exercise   Blood Pressure (Admit) 151/84    Blood Pressure (Exercise) 164/62    Blood Pressure (Exit) 130/62    Heart Rate (Admit) 59 bpm    Heart Rate (Exercise) 105 bpm    Heart Rate (Exit) 68 bpm    Oxygen Saturation (Admit) 97 %    Oxygen Saturation (Exercise) 94 %    Oxygen Saturation (Exit) 94 %    Rating of Perceived Exertion (Exercise) 12    Perceived Dyspnea (Exercise) 1    Duration Continue with 30 min of aerobic exercise without signs/symptoms of physical distress.    Intensity THRR unchanged      Progression   Progression Continue to progress workloads to maintain intensity without signs/symptoms of physical distress.      Resistance Training   Training Prescription Yes    Weight blue bands    Reps  10-15    Time 10 Minutes      Treadmill   MPH 2.1    Grade 1    Minutes 15      NuStep   Level 3    SPM 80    Minutes 15    METs 2.8           Social History   Tobacco Use  Smoking Status Former Smoker  . Packs/day: 0.25  . Years: 59.00  . Pack years: 14.75  . Types: Cigarettes  . Start date: 1961  Smokeless Tobacco Never Used  Tobacco Comment   Quit end of july 2021    Goals Met:  Proper associated with RPD/PD & O2 Sat Exercise tolerated well Strength training completed today  Goals Unmet:  Not Applicable  Comments: Service time is from 1012 to 1126    Dr. Fransico Him is Medical Director for Cardiac Rehab at [facility].

## 2020-05-24 ENCOUNTER — Encounter (HOSPITAL_COMMUNITY)
Admission: RE | Admit: 2020-05-24 | Discharge: 2020-05-24 | Disposition: A | Discharge status: Home / Self Care | Payer: Medicare Other | Source: Ambulatory Visit | Attending: Pulmonary Disease | Admitting: Pulmonary Disease

## 2020-05-24 ENCOUNTER — Other Ambulatory Visit: Payer: Self-pay

## 2020-05-24 DIAGNOSIS — J432 Centrilobular emphysema: Secondary | ICD-10-CM | POA: Diagnosis not present

## 2020-05-24 DIAGNOSIS — R0902 Hypoxemia: Secondary | ICD-10-CM | POA: Diagnosis not present

## 2020-05-24 DIAGNOSIS — Z87891 Personal history of nicotine dependence: Secondary | ICD-10-CM | POA: Diagnosis not present

## 2020-05-24 NOTE — Progress Notes (Signed)
Daily Session Note  Patient Details  Name: Heidi Ramirez MRN: [id number] Date of Birth: [birth year] Referring Provider:   April Manson Pulmonary Rehab Walk Test from 05/04/2020 in [place]  Referring Provider Dr. Elsworth Soho      Encounter Date: 05/24/2020  Check In:  Session Check In - 05/24/20 1016      Check-In   Supervising physician immediately available to respond to emergencies [facility] immediately available    Physician(s) Dr. Tyrell Antonio    [facility]    Staff Present Rosebud Poles, RN, BSN;Lisa Ysidro Evert, RN;Jenavee Laguardia Hassell Done, MS, ACSM-CEP, Exercise Physiologist    Virtual Visit No    Medication changes reported     No    Fall or balance concerns reported    No    Tobacco Cessation No Change    Warm-up and Cool-down Performed on first and last piece of equipment    Resistance Training Performed Yes    VAD Patient? No    PAD/SET Patient? No      Pain Assessment   Currently in Pain? No/denies    Multiple Pain Sites No           Capillary Blood Glucose: No results found for this or any previous visit (from the past 24 hour(s)).    Social History   Tobacco Use  Smoking Status Former Smoker  . Packs/day: 0.25  . Years: 59.00  . Pack years: 14.75  . Types: Cigarettes  . Start date: 1961  Smokeless Tobacco Never Used  Tobacco Comment   Quit end of july 2021    Goals Met:  Proper associated with RPD/PD & O2 Sat Exercise tolerated well No report of cardiac concerns or symptoms Strength training completed today  Goals Unmet:  Not Applicable  Comments: Service time is from 1010 to 1125    Dr. Fransico Him is Medical Director for Cardiac Rehab at [facility].

## 2020-05-29 ENCOUNTER — Encounter (HOSPITAL_COMMUNITY)
Admission: RE | Admit: 2020-05-29 | Discharge: 2020-05-29 | Disposition: A | Discharge status: Home / Self Care | Payer: Medicare Other | Source: Ambulatory Visit | Attending: Pulmonary Disease | Admitting: Pulmonary Disease

## 2020-05-29 ENCOUNTER — Other Ambulatory Visit: Payer: Self-pay

## 2020-05-29 DIAGNOSIS — R0602 Shortness of breath: Secondary | ICD-10-CM | POA: Insufficient documentation

## 2020-05-29 DIAGNOSIS — J432 Centrilobular emphysema: Secondary | ICD-10-CM | POA: Diagnosis not present

## 2020-05-29 NOTE — Progress Notes (Signed)
Daily Session Note  Patient Details  Name: Heidi Ramirez MRN: [id number] Date of Birth: 1950-05-05 Referring Provider:   April Manson Pulmonary Rehab Walk Test from 05/04/2020 in [place]  Referring Provider Dr. Elsworth Soho      Encounter Date: 05/29/2020  Check In:  Session Check In - 05/29/20 1039      Check-In   Supervising physician immediately available to respond to emergencies [facility] immediately available    Physician(s) Dr. Venetia Constable    [facility]    Staff Present Rosebud Poles, RN, BSN;Carlette Wilber Oliphant, RN, BSN;Melvine Julin Ysidro Evert, RN;Jessica Hassell Done, MS, ACSM-CEP, Exercise Physiologist    Virtual Visit No    Medication changes reported     No    Fall or balance concerns reported    No    Tobacco Cessation No Change    Warm-up and Cool-down Performed on first and last piece of equipment    Resistance Training Performed Yes    VAD Patient? No    PAD/SET Patient? No      Pain Assessment   Currently in Pain? No/denies    Multiple Pain Sites No           Capillary Blood Glucose: No results found for this or any previous visit (from the past 24 hour(s)).    Social History   Tobacco Use  Smoking Status Former Smoker  . Packs/day: 0.25  . Years: 59.00  . Pack years: 14.75  . Types: Cigarettes  . Start date: 1961  Smokeless Tobacco Never Used  Tobacco Comment   Quit end of july 2021    Goals Met:  Exercise tolerated well No report of cardiac concerns or symptoms Strength training completed today  Goals Unmet:  Not Applicable  Comments: Service time is from 1015 to 1135    Dr. Fransico Him is Medical Director for Cardiac Rehab at [facility].

## 2020-05-29 NOTE — Progress Notes (Signed)
Pulmonary Individual Treatment Plan  Patient Details  Name: Heidi Ramirez MRN: [id number] Date of Birth: [birth year] Referring Provider:   Doristine Devoid Pulmonary Rehab Walk Test from 05/04/2020 in [facility] CARDIAC [facility]  Referring Provider Dr. Vassie Loll      Initial Encounter Date:  Flowsheet Row Pulmonary Rehab Walk Test from 05/04/2020 in [facility] CARDIAC REHAB  Date 05/04/20      Visit Diagnosis: Centrilobular emphysema (HCC)  Patient's Home Medications on Admission:   Current Outpatient Medications:  .  albuterol (VENTOLIN HFA) 108 (90 Base) MCG/ACT inhaler, Inhale 2 puffs into the lungs every 6 (six) hours as needed for wheezing or shortness of breath., Disp: 8 g, Rfl: 1 .  amLODipine (NORVASC) 5 MG tablet, Take 1 tablet (5 mg total) by mouth daily., Disp: 30 tablet, Rfl: 0 .  Fluticasone-Umeclidin-Vilant (TRELEGY ELLIPTA) 200-62.5-25 MCG/INH AEPB, Inhale 1 puff into the lungs daily., Disp: 60 each, Rfl: 6 .  Spacer/Aero-Holding Chambers (AEROCHAMBER MV) inhaler, Use as instructed (Patient taking differently: 1 each by Other route as directed. ), Disp: 1 each, Rfl: 0  Past Medical History: Past Medical History:  Diagnosis Date  . Heart murmur    since birth  . HTN (hypertension)   . Melanoma of trunk (HCC)     Tobacco Use: Social History   Tobacco Use  Smoking Status Former Smoker  . Packs/day: 0.25  . Years: 59.00  . Pack years: 14.75  . Types: Cigarettes  . Start date: 1961  Smokeless Tobacco Never Used  Tobacco Comment   Quit end of july 2021    Labs: Recent Review Flowsheet Data   There is no flowsheet data to display.     Capillary Blood Glucose: No results found for: GLUCAP   Pulmonary Assessment Scores:  Pulmonary Assessment Scores    Row Name 05/04/20 1026         ADL UCSD   ADL Phase Entry     SOB Score total 4           CAT Score   CAT Score 4           mMRC Score   mMRC Score 1            UCSD: Self-administered rating of dyspnea associated with activities of daily living (ADLs) 6-point scale (0 = "not at all" to 5 = "maximal or unable to do because of breathlessness")  Scoring Scores range from 0 to 120.  Minimally important difference is 5 units  CAT: CAT can identify the health impairment of COPD patients and is better correlated with disease progression.  CAT has a scoring range of zero to 40. The CAT score is classified into four groups of low (less than 10), medium (10 - 20), high (21-30) and very high (31-40) based on the impact level of disease on health status. A CAT score over 10 suggests significant symptoms.  A worsening CAT score could be explained by an exacerbation, poor medication adherence, poor inhaler technique, or progression of COPD or comorbid conditions.  CAT MCID is 2 points  mMRC: mMRC (Modified Medical Research Council) Dyspnea Scale is used to assess the degree of baseline functional disability in patients of respiratory disease due to dyspnea. No minimal important difference is established. A decrease in score of 1 point or greater is considered a positive change.   Pulmonary Function Assessment:  Pulmonary Function Assessment - 05/04/20 0920      Breath   Shortness  of Breath Yes;Limiting activity           Exercise Target Goals: Exercise Program Goal: Individual exercise prescription set using results from initial 6 min walk test and THRR while considering  patient's activity barriers and safety.   Exercise Prescription Goal: Initial exercise prescription builds to 30-45 minutes a day of aerobic activity, 2-3 days per week.  Home exercise guidelines will be given to patient during program as part of exercise prescription that the participant will acknowledge.  Activity Barriers & Risk Stratification:  Activity Barriers & Cardiac Risk Stratification - 05/04/20 0919      Activity Barriers & Cardiac Risk Stratification   Activity  Barriers Shortness of Breath;Deconditioning    Cardiac Risk Stratification Low           6 Minute Walk:  6 Minute Walk    Row Name 05/04/20 1230         6 Minute Walk   Phase Initial     Distance 1393 feet     Walk Time 6 minutes     # of Rest Breaks 0     MPH 2.64     METS 3.64     RPE 9     Perceived Dyspnea  1     VO2 Peak 12.74     Symptoms No     Resting HR 78 bpm     Resting BP 142/72     Resting Oxygen Saturation  95 %     Exercise Oxygen Saturation  during 6 min walk 92 %     Max Ex. HR 109 bpm     Max Ex. BP 172/60     2 Minute Post BP 150/64           Interval HR   1 Minute HR 104     2 Minute HR 102     3 Minute HR 109     4 Minute HR 107     5 Minute HR 106     6 Minute HR 90     2 Minute Post HR 81     Interval Heart Rate? Yes           Interval Oxygen   Interval Oxygen? Yes     Baseline Oxygen Saturation % 95 %     1 Minute Oxygen Saturation % 93 %     1 Minute Liters of Oxygen 0 L     2 Minute Oxygen Saturation % 92 %     2 Minute Liters of Oxygen 0 L     3 Minute Oxygen Saturation % 93 %     3 Minute Liters of Oxygen 0 L     4 Minute Oxygen Saturation % 94 %     4 Minute Liters of Oxygen 0 L     5 Minute Oxygen Saturation % 94 %     5 Minute Liters of Oxygen 0 L     6 Minute Oxygen Saturation % 95 %     6 Minute Liters of Oxygen 0 L     2 Minute Post Oxygen Saturation % 97 %     2 Minute Post Liters of Oxygen 0 L            Oxygen Initial Assessment:  Oxygen Initial Assessment - 05/04/20 0920      Home Oxygen   Home Oxygen Device None    Sleep Oxygen Prescription None    Home Exercise Oxygen Prescription None  Home Resting Oxygen Prescription None      Initial 6 min Walk   Oxygen Used None      Program Oxygen Prescription   Program Oxygen Prescription None      Intervention   Short Term Goals To learn and understand importance of monitoring SPO2 with pulse oximeter and demonstrate accurate use of the pulse  oximeter.;To learn and understand importance of maintaining oxygen saturations>88%;To learn and demonstrate proper pursed lip breathing techniques or other breathing techniques.;To learn and demonstrate proper use of respiratory medications    Long  Term Goals Verbalizes importance of monitoring SPO2 with pulse oximeter and return demonstration;Maintenance of O2 saturations>88%;Exhibits proper breathing techniques, such as pursed lip breathing or other method taught during program session;Compliance with respiratory medication;Demonstrates proper use of MDI's           Oxygen Re-Evaluation:  Oxygen Re-Evaluation    Row Name 05/08/20 0919 05/28/20 0923           Program Oxygen Prescription   Program Oxygen Prescription None None             Home Oxygen   Home Oxygen Device None None      Sleep Oxygen Prescription None None      Home Exercise Oxygen Prescription None None      Home Resting Oxygen Prescription None None             Goals/Expected Outcomes   Short Term Goals To learn and understand importance of monitoring SPO2 with pulse oximeter and demonstrate accurate use of the pulse oximeter.;To learn and understand importance of maintaining oxygen saturations>88%;To learn and demonstrate proper pursed lip breathing techniques or other breathing techniques.;To learn and demonstrate proper use of respiratory medications To learn and understand importance of monitoring SPO2 with pulse oximeter and demonstrate accurate use of the pulse oximeter.;To learn and understand importance of maintaining oxygen saturations>88%;To learn and demonstrate proper pursed lip breathing techniques or other breathing techniques.;To learn and demonstrate proper use of respiratory medications      Long  Term Goals Verbalizes importance of monitoring SPO2 with pulse oximeter and return demonstration;Maintenance of O2 saturations>88%;Exhibits proper breathing techniques, such as pursed lip breathing or other  method taught during program session;Compliance with respiratory medication;Demonstrates proper use of MDI's Verbalizes importance of monitoring SPO2 with pulse oximeter and return demonstration;Maintenance of O2 saturations>88%;Exhibits proper breathing techniques, such as pursed lip breathing or other method taught during program session;Compliance with respiratory medication;Demonstrates proper use of MDI's      Comments - Patient is compliant with monitoring oxygen saturation and performing pursed lip breathing.      Goals/Expected Outcomes Compliance and understanding of oxygen saturation and pursed lip breathing. Compliance and understanding of oxygen saturation and pursed lip breathing.             Oxygen Discharge (Final Oxygen Re-Evaluation):  Oxygen Re-Evaluation - 05/28/20 0923      Program Oxygen Prescription   Program Oxygen Prescription None      Home Oxygen   Home Oxygen Device None    Sleep Oxygen Prescription None    Home Exercise Oxygen Prescription None    Home Resting Oxygen Prescription None      Goals/Expected Outcomes   Short Term Goals To learn and understand importance of monitoring SPO2 with pulse oximeter and demonstrate accurate use of the pulse oximeter.;To learn and understand importance of maintaining oxygen saturations>88%;To learn and demonstrate proper pursed lip breathing techniques or other breathing techniques.;To learn and demonstrate  proper use of respiratory medications    Long  Term Goals Verbalizes importance of monitoring SPO2 with pulse oximeter and return demonstration;Maintenance of O2 saturations>88%;Exhibits proper breathing techniques, such as pursed lip breathing or other method taught during program session;Compliance with respiratory medication;Demonstrates proper use of MDI's    Comments Patient is compliant with monitoring oxygen saturation and performing pursed lip breathing.    Goals/Expected Outcomes Compliance and understanding of  oxygen saturation and pursed lip breathing.           Initial Exercise Prescription:  Initial Exercise Prescription - 05/04/20 1400      Date of Initial Exercise RX and Referring Provider   Date 05/04/20    Referring Provider Dr. Elsworth Soho    Expected Discharge Date 07/05/20      Treadmill   MPH 2.2    Grade 0    Minutes 15      NuStep   Level 2    SPM 80    Minutes 15      Prescription Details   Frequency (times per week) 2    Duration Progress to 45 minutes of aerobic exercise without signs/symptoms of physical distress      Intensity   THRR 40-80% of Max Heartrate 61-122    Ratings of Perceived Exertion 11-13    Perceived Dyspnea 0-4      Progression   Progression Continue to progress workloads to maintain intensity without signs/symptoms of physical distress.      Resistance Training   Training Prescription Yes    Weight Blue bands    Reps 10-15           Perform Capillary Blood Glucose checks as needed.  Exercise Prescription Changes:  Exercise Prescription Changes    Row Name 05/08/20 1100 05/22/20 1100 05/28/20 0900         Response to Exercise   Blood Pressure (Admit) 152/70 151/84 -     Blood Pressure (Exercise) 130/62 164/62 -     Blood Pressure (Exit) 142/52 130/62 -     Heart Rate (Admit) 79 bpm 59 bpm -     Heart Rate (Exercise) 111 bpm 105 bpm -     Heart Rate (Exit) 84 bpm 68 bpm -     Oxygen Saturation (Admit) 99 % 97 % -     Oxygen Saturation (Exercise) 93 % 94 % -     Oxygen Saturation (Exit) 95 % 94 % -     Rating of Perceived Exertion (Exercise) 11 12 -     Perceived Dyspnea (Exercise) 0 1 -     Duration Continue with 30 min of aerobic exercise without signs/symptoms of physical distress. Continue with 30 min of aerobic exercise without signs/symptoms of physical distress. -     Intensity Other (comment)  40-80% of HRR THRR unchanged -           Progression   Progression Continue to progress workloads to maintain intensity without  signs/symptoms of physical distress. Continue to progress workloads to maintain intensity without signs/symptoms of physical distress. -     Average METs - - 4.3           Resistance Training   Training Prescription Yes Yes -     Weight blue bands blue bands -     Reps 10-15 10-15 -     Time 10 Minutes 10 Minutes -           Treadmill   MPH 1.6 2.1 -  Grade 0 1 -     Minutes 15 15 -           NuStep   Level 2 3 -     SPM 80 80 -     Minutes 15 15 -     METs 2.1 2.8 -            Exercise Comments:  Exercise Comments    Row Name 05/08/20 1211           Exercise Comments Patient completed first day of exercise and tolerated well with no complaints or concerns. She was able to do 30 minutes of cardio with no breaks. She also completed all resistance band exercises with no concerns. She stated she was a little fatigued after all of it but no pain.              Exercise Goals and Review:  Exercise Goals    Row Name 05/04/20 1017             Exercise Goals   Increase Physical Activity Yes       Intervention Provide advice, education, support and counseling about physical activity/exercise needs.;Develop an individualized exercise prescription for aerobic and resistive training based on initial evaluation findings, risk stratification, comorbidities and participant's personal goals.       Expected Outcomes Short Term: Attend rehab on a regular basis to increase amount of physical activity.;Long Term: Add in home exercise to make exercise part of routine and to increase amount of physical activity.;Long Term: Exercising regularly at least 3-5 days a week.       Increase Strength and Stamina Yes       Intervention Provide advice, education, support and counseling about physical activity/exercise needs.;Develop an individualized exercise prescription for aerobic and resistive training based on initial evaluation findings, risk stratification, comorbidities and  participant's personal goals.       Expected Outcomes Short Term: Increase workloads from initial exercise prescription for resistance, speed, and METs.;Short Term: Perform resistance training exercises routinely during rehab and add in resistance training at home;Long Term: Improve cardiorespiratory fitness, muscular endurance and strength as measured by increased METs and functional capacity (6MWT)       Able to understand and use rate of perceived exertion (RPE) scale Yes       Intervention Provide education and explanation on how to use RPE scale       Expected Outcomes Short Term: Able to use RPE daily in rehab to express subjective intensity level;Long Term:  Able to use RPE to guide intensity level when exercising independently       Able to understand and use Dyspnea scale Yes       Intervention Provide education and explanation on how to use Dyspnea scale       Expected Outcomes Short Term: Able to use Dyspnea scale daily in rehab to express subjective sense of shortness of breath during exertion;Long Term: Able to use Dyspnea scale to guide intensity level when exercising independently       Knowledge and understanding of Target Heart Rate Range (THRR) Yes       Intervention Provide education and explanation of THRR including how the numbers were predicted and where they are located for reference       Expected Outcomes Short Term: Able to state/look up THRR;Long Term: Able to use THRR to govern intensity when exercising independently;Short Term: Able to use daily as guideline for intensity in rehab  Understanding of Exercise Prescription Yes       Intervention Provide education, explanation, and written materials on patient's individual exercise prescription       Expected Outcomes Short Term: Able to explain program exercise prescription;Long Term: Able to explain home exercise prescription to exercise independently              Exercise Goals Re-Evaluation :  Exercise Goals  Re-Evaluation    Row Name 05/08/20 0919 05/28/20 0920           Exercise Goal Re-Evaluation   Exercise Goals Review Increase Physical Activity;Increase Strength and Stamina;Able to understand and use rate of perceived exertion (RPE) scale;Able to understand and use Dyspnea scale;Knowledge and understanding of Target Heart Rate Range (THRR);Understanding of Exercise Prescription Increase Physical Activity;Increase Strength and Stamina;Able to understand and use rate of perceived exertion (RPE) scale;Able to understand and use Dyspnea scale;Knowledge and understanding of Target Heart Rate Range (THRR);Understanding of Exercise Prescription      Comments Patient's first day of exercise is today. It is too early to see any progression. Will continue to monitor and progress as she is able. Heidi Ramirez has completed 6 exercise sessions and has tolerated exercise very well. She is reaching the point of being independent with all exercises and is making progressions with workload increases and MET level increases. She is exercising at 2.7 METS on the Nustep and 4.6 METS on the treadmill. Will continue to monitor and progress as she is able.      Expected Outcomes Through exercise at rehab and home the patient will decrease shortness of breath with daily activities and feel confident in carrying out an exercise regimn at home. Through exercise at rehab and home the patient will decrease shortness of breath with daily activities and feel confident in carrying out an exercise regimn at home.             Discharge Exercise Prescription (Final Exercise Prescription Changes):  Exercise Prescription Changes - 05/28/20 0900      Progression   Average METs 4.3           Nutrition:  Target Goals: Understanding of nutrition guidelines, daily intake of sodium '1500mg'$ , cholesterol '200mg'$ , calories 30% from fat and 7% or less from saturated fats, daily to have 5 or more servings of fruits and  vegetables.  Biometrics:  Pre Biometrics - 05/04/20 1018      Pre Biometrics   Grip Strength 18 kg            Nutrition Therapy Plan and Nutrition Goals:  Nutrition Therapy & Goals - 05/17/20 1124      Nutrition Therapy   Diet Generally Healthful      Personal Nutrition Goals   Nutrition Goal Pt to identify food quantities necessary to achieve weight loss of 4-8 lb at graduation from pulmonary rehab.    Personal Goal #2 Pt to swap out processed snacks for more healthful snacks      Intervention Plan   Intervention Prescribe, educate and counsel regarding individualized specific dietary modifications aiming towards targeted core components such as weight, hypertension, lipid management, diabetes, heart failure and other comorbidities.;Nutrition handout(s) given to patient.    Expected Outcomes Short Term Goal: A plan has been developed with personal nutrition goals set during dietitian appointment.;Long Term Goal: Adherence to prescribed nutrition plan.           Nutrition Assessments:  MEDIFICTS Score Key:  ?70 Need to make dietary changes   40-70 Heart Healthy  Diet  ? 40 Therapeutic Level Cholesterol Diet  Flowsheet Row PULMONARY REHAB OTHER RESPIRATORY from 05/17/2020 in [facility]  Picture Your Plate Total Score on Admission 59     Picture Your Plate Scores:  <16 Unhealthy dietary pattern with much room for improvement.  41-50 Dietary pattern unlikely to meet recommendations for good health and room for improvement.  51-60 More healthful dietary pattern, with some room for improvement.   >60 Healthy dietary pattern, although there may be some specific behaviors that could be improved.    Nutrition Goals Re-Evaluation:  Nutrition Goals Re-Evaluation    [place] Name 05/17/20 1125 05/24/20 0849           Goals   Current Weight 160 lb (72.6 kg) 162 lb 4.1 oz (73.6 kg)      Nutrition Goal Pt to identify food quantities  necessary to achieve weight loss of 4-8 lb at graduation from pulmonary rehab. -      Expected Outcome Weight loss -             Personal Goal #2 Re-Evaluation   Personal Goal #2 Pt to swap out processed snacks for more healthful snacks -             Nutrition Goals Discharge (Final Nutrition Goals Re-Evaluation):  Nutrition Goals Re-Evaluation - 05/24/20 0849      Goals   Current Weight 162 lb 4.1 oz (73.6 kg)           Psychosocial: Target Goals: Acknowledge presence or absence of significant depression and/or stress, maximize coping skills, provide positive support system. Participant is able to verbalize types and ability to use techniques and skills needed for reducing stress and depression.  Initial Review & Psychosocial Screening:  Initial Psych Review & Screening - 05/04/20 1019      Family Dynamics   Comments Widowed, husband passed away 64 years ago, but she has other family that is local and sees often      Screening Interventions   Expected Outcomes Long Term Goal: Stressors or current issues are controlled or eliminated.           Quality of Life Scores:  Scores of 19 and below usually indicate a poorer quality of life in these areas.  A difference of  2-3 points is a clinically meaningful difference.  A difference of 2-3 points in the total score of the Quality of Life Index has been associated with significant improvement in overall quality of life, self-image, physical symptoms, and general health in studies assessing change in quality of life.  PHQ-9: Recent Review Flowsheet Data    Depression screen [facility] 2/9 05/04/2020   Decreased Interest 0   Down, Depressed, Hopeless 0   PHQ - 2 Score 0   Altered sleeping 0   Tired, decreased energy 0   Change in appetite 0   Feeling bad or failure about yourself  0   Trouble concentrating 0   Moving slowly or fidgety/restless 0   Suicidal thoughts 0   Difficult doing work/chores Not difficult at all      Interpretation of Total Score  Total Score Depression Severity:  1-4 = Minimal depression, 5-9 = Mild depression, 10-14 = Moderate depression, 15-19 = Moderately severe depression, 20-27 = Severe depression   Psychosocial Evaluation and Intervention:   Psychosocial Re-Evaluation:  Psychosocial Re-Evaluation    [place] Name 05/07/20 1214 05/07/20 1215 05/24/20 0920         Psychosocial Re-Evaluation  Current issues with Current Stress Concerns Current Stress Concerns None Identified     Comments - Heidi Ramirez will begin exercising in pulmonary rehab tomorrow, no change in psychosocial status since orientation. No concerns identified     Expected Outcomes - For Heidi Ramirez to handle her stress in healthy manners. To continue to have no psychsocial concerns while in pulmonary rehab.     Interventions - Stress management education;Encouraged to attend Pulmonary Rehabilitation for the exercise Encouraged to attend Pulmonary Rehabilitation for the exercise     Continue Psychosocial Services  - No Follow up required No Follow up required            Psychosocial Discharge (Final Psychosocial Re-Evaluation):  Psychosocial Re-Evaluation - 05/24/20 0920      Psychosocial Re-Evaluation   Current issues with None Identified    Comments No concerns identified    Expected Outcomes To continue to have no psychsocial concerns while in pulmonary rehab.    Interventions Encouraged to attend Pulmonary Rehabilitation for the exercise    Continue Psychosocial Services  No Follow up required           Education: Education Goals: Education classes will be provided on a weekly basis, covering required topics. Participant will state understanding/return demonstration of topics presented.  Learning Barriers/Preferences:  Learning Barriers/Preferences - 05/04/20 0923      Learning Barriers/Preferences   Learning Barriers None    Learning Preferences Written Material;Audio           Education  Topics: Risk Factor Reduction:  -Group instruction that is supported by a PowerPoint presentation. Instructor discusses the definition of a risk factor, different risk factors for pulmonary disease, and how the heart and lungs work together.     Nutrition for Pulmonary Patient:  -Group instruction provided by PowerPoint slides, verbal discussion, and written materials to support subject matter. The instructor gives an explanation and review of healthy diet recommendations, which includes a discussion on weight management, recommendations for fruit and vegetable consumption, as well as protein, fluid, caffeine, fiber, sodium, sugar, and alcohol. Tips for eating when patients are short of breath are discussed. Flowsheet Row PULMONARY REHAB OTHER RESPIRATORY from 05/22/2020 in [place]  Date 05/22/20  Educator handout      Pursed Lip Breathing:  -Group instruction that is supported by demonstration and informational handouts. Instructor discusses the benefits of pursed lip and diaphragmatic breathing and detailed demonstration on how to preform both.     Oxygen Safety:  -Group instruction provided by PowerPoint, verbal discussion, and written material to support subject matter. There is an overview of "What is Oxygen" and "Why do we need it".  Instructor also reviews how to create a safe environment for oxygen use, the importance of using oxygen as prescribed, and the risks of noncompliance. There is a brief discussion on traveling with oxygen and resources the patient may utilize.   Oxygen Equipment:  -Group instruction provided by [facility] Staff utilizing handouts, written materials, and equipment demonstrations.   Signs and Symptoms:  -Group instruction provided by written material and verbal discussion to support subject matter. Warning signs and symptoms of infection, stroke, and heart attack are reviewed and when to call the physician/911 reinforced.  Tips for preventing the spread of infection discussed.   Advanced Directives:  -Group instruction provided by verbal instruction and written material to support subject matter. Instructor reviews Advanced Directive laws and proper instruction for filling out document.   Pulmonary Video:  -Group video  education that reviews the importance of medication and oxygen compliance, exercise, good nutrition, pulmonary hygiene, and pursed lip and diaphragmatic breathing for the pulmonary patient.   Exercise for the Pulmonary Patient:  -Group instruction that is supported by a PowerPoint presentation. Instructor discusses benefits of exercise, core components of exercise, frequency, duration, and intensity of an exercise routine, importance of utilizing pulse oximetry during exercise, safety while exercising, and options of places to exercise outside of rehab.     Pulmonary Medications:  -Verbally interactive group education provided by instructor with focus on inhaled medications and proper administration.   Anatomy and Physiology of the Respiratory System and Intimacy:  -Group instruction provided by PowerPoint, verbal discussion, and written material to support subject matter. Instructor reviews respiratory cycle and anatomical components of the respiratory system and their functions. Instructor also reviews differences in obstructive and restrictive respiratory diseases with examples of each. Intimacy, Sex, and Sexuality differences are reviewed with a discussion on how relationships can change when diagnosed with pulmonary disease. Common sexual concerns are reviewed.   MD DAY -A group question and answer session with a medical doctor that allows participants to ask questions that relate to their pulmonary disease state.   OTHER EDUCATION -Group or individual verbal, written, or video instructions that support the educational goals of the pulmonary rehab program. [place] from 05/22/2020 in [place]  Date --  [MET [place]  Educator Handout-Jessica  Instruction Review Code 1- Verbalizes Understanding      Holiday Eating Survival Tips:  -Group instruction provided by PowerPoint slides, verbal discussion, and written materials to support subject matter. The instructor gives patients tips, tricks, and techniques to help them not only survive but enjoy the holidays despite the onslaught of food that accompanies the holidays.   Knowledge Questionnaire Score:  Knowledge Questionnaire Score - 05/04/20 1025      Knowledge Questionnaire Score   Pre Score 16/18           Core Components/Risk Factors/Patient Goals at Admission:  Personal Goals and Risk Factors at Admission - 05/04/20 1020      Core Components/Risk Factors/Patient Goals on Admission    Weight Management Weight Loss    Improve shortness of breath with ADL's Yes    Intervention Provide education, individualized exercise plan and daily activity instruction to help decrease symptoms of SOB with activities of daily living.    Expected Outcomes Short Term: Improve cardiorespiratory fitness to achieve a reduction of symptoms when performing ADLs;Long Term: Be able to perform more ADLs without symptoms or delay the onset of symptoms    Hypertension Yes    Intervention Provide education on lifestyle modifcations including regular physical activity/exercise, weight management, moderate sodium restriction and increased consumption of fresh fruit, vegetables, and low fat dairy, alcohol moderation, and smoking cessation.    Expected Outcomes Long Term: Maintenance of blood pressure at goal levels.           Core Components/Risk Factors/Patient Goals Review:   Goals and Risk Factor Review    Row Name 05/07/20 1217 05/24/20 0920           Core Components/Risk Factors/Patient Goals Review   Personal Goals Review Increase knowledge of respiratory  medications and ability to use respiratory devices properly.;Improve shortness of breath with ADL's;Develop more efficient breathing techniques such as purse lipped breathing and diaphragmatic breathing and practicing self-pacing with activity. Increase knowledge of respiratory medications and ability to use respiratory devices  properly.;Improve shortness of breath with ADL's;Develop more efficient breathing techniques such as purse lipped breathing and diaphragmatic breathing and practicing self-pacing with activity.      Review Heidi Ramirez will begin exercising in pulmonary rehab tomorrow, 05/08/2020.  We will assist Heidi Ramirez in meeting these goals. Her attendance has been excellent, is progressing well with her exercise.      Expected Outcomes See admission goals. See admission goals.             Core Components/Risk Factors/Patient Goals at Discharge (Final Review):   Goals and Risk Factor Review - 05/24/20 0920      Core Components/Risk Factors/Patient Goals Review   Personal Goals Review Increase knowledge of respiratory medications and ability to use respiratory devices properly.;Improve shortness of breath with ADL's;Develop more efficient breathing techniques such as purse lipped breathing and diaphragmatic breathing and practicing self-pacing with activity.    Review Her attendance has been excellent, is progressing well with her exercise.    Expected Outcomes See admission goals.           ITP Comments:   Comments: ITP REVIEW Pt is making expected progress toward pulmonary rehab goals after completing 6 sessions. Recommend continued exercise, life style modification, education, and utilization of breathing techniques to increase stamina and strength and decrease shortness of breath with exertion.

## 2020-05-31 ENCOUNTER — Other Ambulatory Visit: Payer: Self-pay

## 2020-05-31 ENCOUNTER — Encounter (HOSPITAL_COMMUNITY)
Admission: RE | Admit: 2020-05-31 | Discharge: 2020-05-31 | Disposition: A | Discharge status: Home / Self Care | Payer: Medicare Other | Source: Ambulatory Visit | Attending: Pulmonary Disease | Admitting: Pulmonary Disease

## 2020-05-31 DIAGNOSIS — R0602 Shortness of breath: Secondary | ICD-10-CM | POA: Diagnosis not present

## 2020-05-31 DIAGNOSIS — J432 Centrilobular emphysema: Secondary | ICD-10-CM

## 2020-05-31 NOTE — Progress Notes (Signed)
I have reviewed a Home Exercise Prescription with Heidi Ramirez . Heidi Ramirez is currently exercising at home. Patient states she does virtual yoga at home and also does strength training.The patient was advised to continue with yoga and strength training and to also add walking 3 days a week for 30-45 minutes.  Heidi Ramirez and I discussed how to progress their exercise prescription.  The patient stated that their goals were to be in better shape and to make improvements with exercise.  The patient stated that they understand the exercise prescription.  We reviewed exercise guidelines, target heart rate during exercise, RPE Scale, weather conditions, rescue inhaler use, endpoints for exercise, warmup and cool down.  Patient is encouraged to come to me with any questions. I will continue to follow up with the patient to assist them with progression and safety.    Rick Duff MS, ACSM CEP

## 2020-05-31 NOTE — Progress Notes (Signed)
Daily Session Note  Patient Details  Name: Heidi Ramirez MRN: [id number] Date of Birth: 01-20-1951 Referring Provider:   April Manson Pulmonary Rehab Walk Test from 05/04/2020 in [place]  Referring Provider Dr. Elsworth Soho      Encounter Date: 05/31/2020  Check In:  Session Check In - 05/31/20 1031      Check-In   Supervising physician immediately available to respond to emergencies [facility] immediately available    Physician(s) Dr. Doristine Bosworth    Location [facility]    Staff Present Rosebud Poles, RN, BSN;Lisa Ysidro Evert, RN;Jessica Hassell Done, MS, ACSM-CEP, Exercise Physiologist    Virtual Visit No    Medication changes reported     No    Fall or balance concerns reported    No    Tobacco Cessation No Change    Warm-up and Cool-down Performed on first and last piece of equipment    Resistance Training Performed Yes    VAD Patient? No    PAD/SET Patient? No      Pain Assessment   Currently in Pain? No/denies    Multiple Pain Sites No           Capillary Blood Glucose: No results found for this or any previous visit (from the past 24 hour(s)).    Social History   Tobacco Use  Smoking Status Former Smoker  . Packs/day: 0.25  . Years: 59.00  . Pack years: 14.75  . Types: Cigarettes  . Start date: 1961  Smokeless Tobacco Never Used  Tobacco Comment   Quit end of july 2021    Goals Met:  Exercise tolerated well No report of cardiac concerns or symptoms Strength training completed today  Goals Unmet:  Not Applicable  Comments: Service time is from 1010 to 1126    Dr. Fransico Him is Medical Director for Cardiac Rehab at [facility].

## 2020-06-05 ENCOUNTER — Encounter (HOSPITAL_COMMUNITY)
Admission: RE | Admit: 2020-06-05 | Discharge: 2020-06-05 | Disposition: A | Discharge status: Home / Self Care | Payer: Medicare Other | Source: Ambulatory Visit | Attending: Pulmonary Disease | Admitting: Pulmonary Disease

## 2020-06-05 ENCOUNTER — Other Ambulatory Visit: Payer: Self-pay

## 2020-06-05 VITALS — Wt 161.8 lb

## 2020-06-05 DIAGNOSIS — J432 Centrilobular emphysema: Secondary | ICD-10-CM

## 2020-06-05 DIAGNOSIS — R0602 Shortness of breath: Secondary | ICD-10-CM | POA: Diagnosis not present

## 2020-06-05 NOTE — Progress Notes (Signed)
Daily Session Note  Patient Details  Name: Heidi Ramirez MRN: [id number] Date of Birth: [birth year] Referring Provider:   April Manson Pulmonary Rehab Walk Test from 05/04/2020 in [place]  Referring Provider Dr. Elsworth Soho      Encounter Date: 06/05/2020  Check In:  Session Check In - 06/05/20 1140      Check-In   Supervising physician immediately available to respond to emergencies [facility] immediately available    Physician(s) Dr. Doristine Bosworth    [facility]    Staff Present Rosebud Poles, RN, BSN;Lisa Ysidro Evert, RN;Jessica Hassell Done, MS, ACSM-CEP, Exercise Physiologist    Virtual Visit No    Medication changes reported     No    Fall or balance concerns reported    No    Tobacco Cessation No Change    Warm-up and Cool-down Performed on first and last piece of equipment    Resistance Training Performed Yes    VAD Patient? No    PAD/SET Patient? No      Pain Assessment   Currently in Pain? No/denies    Multiple Pain Sites No           Capillary Blood Glucose: No results found for this or any previous visit (from the past 24 hour(s)).   Exercise Prescription Changes - 06/05/20 1100      Response to Exercise   Blood Pressure (Admit) 126/80    Blood Pressure (Exercise) 170/60    Blood Pressure (Exit) 130/52    Heart Rate (Admit) 64 bpm    Heart Rate (Exercise) 120 bpm    Heart Rate (Exit) 71 bpm    Oxygen Saturation (Admit) 98 %    Oxygen Saturation (Exercise) 94 %    Oxygen Saturation (Exit) 94 %    Rating of Perceived Exertion (Exercise) 8    Perceived Dyspnea (Exercise) 1    Duration Continue with 30 min of aerobic exercise without signs/symptoms of physical distress.    Intensity THRR unchanged      Resistance Training   Training Prescription Yes    Weight blue bands    Reps 10-15    Time 10 Minutes      Treadmill   MPH 2.3    Grade 2.5    Minutes 15      NuStep   Level 4    SPM 80    Minutes 15     METs 3.1           Social History   Tobacco Use  Smoking Status Former Smoker  . Packs/day: 0.25  . Years: 59.00  . Pack years: 14.75  . Types: Cigarettes  . Start date: 1961  Smokeless Tobacco Never Used  Tobacco Comment   Quit end of july 2021    Goals Met:  Proper associated with RPD/PD & O2 Sat Exercise tolerated well Strength training completed today  Goals Unmet:  Not Applicable  Comments: Service time is from 1010 to 1115    Dr. Fransico Him is Medical Director for Cardiac Rehab at [facility].

## 2020-06-07 ENCOUNTER — Other Ambulatory Visit: Payer: Self-pay

## 2020-06-07 ENCOUNTER — Encounter (HOSPITAL_COMMUNITY)
Admission: RE | Admit: 2020-06-07 | Discharge: 2020-06-07 | Disposition: A | Discharge status: Home / Self Care | Payer: Medicare Other | Source: Ambulatory Visit | Attending: Pulmonary Disease | Admitting: Pulmonary Disease

## 2020-06-07 DIAGNOSIS — J432 Centrilobular emphysema: Secondary | ICD-10-CM | POA: Diagnosis not present

## 2020-06-07 DIAGNOSIS — R0602 Shortness of breath: Secondary | ICD-10-CM | POA: Diagnosis not present

## 2020-06-07 NOTE — Progress Notes (Signed)
Daily Session Note  Patient Details  Name: BAYLEE CAMPUS MRN: [id number] Date of Birth: 08-27-1950 Referring Provider:   April Manson Pulmonary Rehab Walk Test from 05/04/2020 in [place]  Referring Provider Dr. Elsworth Soho      Encounter Date: 06/07/2020  Check In:  Session Check In - 06/07/20 1141      Check-In   Supervising physician immediately available to respond to emergencies [facility] immediately available    Physician(s) Dr. Arbutus Ped    [facility]    Staff Present Rosebud Poles, RN, BSN;Lisa Ysidro Evert, RN;Tarisha Fader Hassell Done, MS, ACSM-CEP, Exercise Physiologist    Virtual Visit No    Medication changes reported     No    Fall or balance concerns reported    No    Tobacco Cessation No Change    Warm-up and Cool-down Performed on first and last piece of equipment    Resistance Training Performed Yes    VAD Patient? No    PAD/SET Patient? No      Pain Assessment   Currently in Pain? No/denies    Pain Score 0-No pain    Multiple Pain Sites No           Capillary Blood Glucose: No results found for this or any previous visit (from the past 24 hour(s)).    Social History   Tobacco Use  Smoking Status Former Smoker   Packs/day: 0.25   Years: 59.00   Pack years: 14.75   Types: Cigarettes   Start date: 1961  Smokeless Tobacco Never Used  Tobacco Comment   Quit end of july 2021    Goals Met:  Proper associated with RPD/PD & O2 Sat Exercise tolerated well No report of cardiac concerns or symptoms Strength training completed today  Goals Unmet:  Not Applicable  Comments: Service time is from 1015 to 1120    Dr. Fransico Him is Medical Director for Cardiac Rehab at [facility].

## 2020-06-12 ENCOUNTER — Other Ambulatory Visit: Payer: Self-pay

## 2020-06-12 ENCOUNTER — Encounter (HOSPITAL_COMMUNITY)
Admission: RE | Admit: 2020-06-12 | Discharge: 2020-06-12 | Disposition: A | Discharge status: Home / Self Care | Payer: Medicare Other | Source: Ambulatory Visit | Attending: Pulmonary Disease | Admitting: Pulmonary Disease

## 2020-06-12 DIAGNOSIS — R0602 Shortness of breath: Secondary | ICD-10-CM | POA: Diagnosis not present

## 2020-06-12 DIAGNOSIS — J432 Centrilobular emphysema: Secondary | ICD-10-CM | POA: Diagnosis not present

## 2020-06-12 NOTE — Progress Notes (Signed)
Daily Session Note  Patient Details  Name: Heidi Ramirez MRN: [id number] Date of Birth: 09-18-1950 Referring Provider:   April Manson Pulmonary Rehab Walk Test from 05/04/2020 in [place]  Referring Provider Dr. Elsworth Soho      Encounter Date: 06/12/2020  Check In:  Session Check In - 06/12/20 1107      Check-In   Supervising physician immediately available to respond to emergencies [facility] immediately available    Physician(s) Dr. Arbutus Ped    [facility]    Staff Present Rosebud Poles, RN, Isaac Laud, MS, ACSM-CEP, Exercise Physiologist;Carlette Wilber Oliphant, RN, BSN    Virtual Visit No    Medication changes reported     No    Fall or balance concerns reported    No    Tobacco Cessation No Change    Warm-up and Cool-down Performed on first and last piece of equipment    Resistance Training Performed Yes    VAD Patient? No    PAD/SET Patient? No      Pain Assessment   Currently in Pain? No/denies    Multiple Pain Sites No           Capillary Blood Glucose: No results found for this or any previous visit (from the past 24 hour(s)).    Social History   Tobacco Use  Smoking Status Former Smoker  . Packs/day: 0.25  . Years: 59.00  . Pack years: 14.75  . Types: Cigarettes  . Start date: 1961  Smokeless Tobacco Never Used  Tobacco Comment   Quit end of july 2021    Goals Met:  Proper associated with RPD/PD & O2 Sat Independence with exercise equipment Exercise tolerated well Strength training completed today  Goals Unmet:  Not Applicable  Comments: Service time is from 1013 to 1115    Dr. Fransico Him is Medical Director for Cardiac Rehab at [facility].

## 2020-06-14 ENCOUNTER — Other Ambulatory Visit: Payer: Self-pay

## 2020-06-14 ENCOUNTER — Encounter (HOSPITAL_COMMUNITY)
Admission: RE | Admit: 2020-06-14 | Discharge: 2020-06-14 | Disposition: A | Discharge status: Home / Self Care | Payer: Medicare Other | Source: Ambulatory Visit | Attending: Pulmonary Disease | Admitting: Pulmonary Disease

## 2020-06-14 DIAGNOSIS — J432 Centrilobular emphysema: Secondary | ICD-10-CM

## 2020-06-14 DIAGNOSIS — R0602 Shortness of breath: Secondary | ICD-10-CM | POA: Diagnosis not present

## 2020-06-14 NOTE — Progress Notes (Signed)
Daily Session Note  Patient Details  Name: Heidi Ramirez MRN: [id number] Date of Birth: 05/11/1950 Referring Provider:   April Manson Pulmonary Rehab Walk Test from 05/04/2020 in [place]  Referring Provider Dr. Elsworth Soho      Encounter Date: 06/14/2020  Check In:  Session Check In - 06/14/20 1027      Check-In   Supervising physician immediately available to respond to emergencies [facility] immediately available    Physician(s) Dr. Florencia Reasons    [facility]    Staff Present Rosebud Poles, RN, Milus Glazier, MS, EP-C, CCRP;Jessica Hassell Done, MS, ACSM-CEP, Exercise Physiologist    Virtual Visit No    Medication changes reported     No    Fall or balance concerns reported    No    Tobacco Cessation No Change    Warm-up and Cool-down Performed on first and last piece of equipment    Resistance Training Performed Yes    VAD Patient? No    PAD/SET Patient? No      Pain Assessment   Currently in Pain? No/denies    Multiple Pain Sites No           Capillary Blood Glucose: No results found for this or any previous visit (from the past 24 hour(s)).    Social History   Tobacco Use  Smoking Status Former Smoker  . Packs/day: 0.25  . Years: 59.00  . Pack years: 14.75  . Types: Cigarettes  . Start date: 1961  Smokeless Tobacco Never Used  Tobacco Comment   Quit end of july 2021    Goals Met:  Proper associated with RPD/PD & O2 Sat Exercise tolerated well Strength training completed today  Goals Unmet:  Not Applicable  Comments: Service time is from 1007 to 1115.    Dr. Fransico Him is Medical Director for Cardiac Rehab at [facility].

## 2020-06-19 ENCOUNTER — Encounter (HOSPITAL_COMMUNITY)
Admission: RE | Admit: 2020-06-19 | Discharge: 2020-06-19 | Disposition: A | Discharge status: Home / Self Care | Payer: Medicare Other | Source: Ambulatory Visit | Attending: Pulmonary Disease | Admitting: Pulmonary Disease

## 2020-06-19 ENCOUNTER — Other Ambulatory Visit: Payer: Self-pay

## 2020-06-19 VITALS — Wt 159.4 lb

## 2020-06-19 DIAGNOSIS — R0602 Shortness of breath: Secondary | ICD-10-CM | POA: Diagnosis not present

## 2020-06-19 DIAGNOSIS — J432 Centrilobular emphysema: Secondary | ICD-10-CM

## 2020-06-19 NOTE — Progress Notes (Signed)
Daily Session Note  Patient Details  Name: Heidi Ramirez MRN: [id number] Date of Birth: [birth year] Referring Provider:   April Manson Pulmonary Rehab Walk Test from 05/04/2020 in [place]  Referring Provider Dr. Elsworth Soho      Encounter Date: 06/19/2020  Check In:  Session Check In - 06/19/20 1028      Check-In   Supervising physician immediately available to respond to emergencies [facility] immediately available    Physician(s) Dr. Tawanna Solo    [facility]    Staff Present Rosebud Poles, RN, BSN;Geraldine Sandberg Ysidro Evert, RN;Jessica Hassell Done, MS, ACSM-CEP, Exercise Physiologist    Virtual Visit No    Medication changes reported     No    Fall or balance concerns reported    No    Tobacco Cessation No Change    Warm-up and Cool-down Performed on first and last piece of equipment    Resistance Training Performed Yes    VAD Patient? No    PAD/SET Patient? No      Pain Assessment   Currently in Pain? No/denies    Multiple Pain Sites No           Capillary Blood Glucose: No results found for this or any previous visit (from the past 24 hour(s)).   Exercise Prescription Changes - 06/19/20 1100      Response to Exercise   Blood Pressure (Admit) 120/80    Blood Pressure (Exercise) 152/70    Blood Pressure (Exit) 100/50    Heart Rate (Admit) 63 bpm    Heart Rate (Exercise) 112 bpm    Heart Rate (Exit) 68 bpm    Oxygen Saturation (Admit) 97 %    Oxygen Saturation (Exercise) 94 %    Oxygen Saturation (Exit) 94 %    Rating of Perceived Exertion (Exercise) 8    Perceived Dyspnea (Exercise) 1    Duration Continue with 30 min of aerobic exercise without signs/symptoms of physical distress.    Intensity THRR unchanged      Progression   Progression Continue to progress workloads to maintain intensity without signs/symptoms of physical distress.      Resistance Training   Training Prescription Yes    Weight blue bands    Reps  10-15    Time 10 Minutes      Treadmill   MPH 2.7    Grade 2.5    Minutes 15      NuStep   Level 5    SPM 80    Minutes 15    METs 3.6           Social History   Tobacco Use  Smoking Status Former Smoker   Packs/day: 0.25   Years: 59.00   Pack years: 14.75   Types: Cigarettes   Start date: 1961  Smokeless Tobacco Never Used  Tobacco Comment   Quit end of july 2021    Goals Met:  Exercise tolerated well No report of cardiac concerns or symptoms Strength training completed today  Goals Unmet:  Not Applicable  Comments: Service time is from 1018 to 1120    Dr. Fransico Him is Medical Director for Cardiac Rehab at [facility].

## 2020-06-21 ENCOUNTER — Other Ambulatory Visit: Payer: Self-pay

## 2020-06-21 ENCOUNTER — Encounter (HOSPITAL_COMMUNITY)
Admission: RE | Admit: 2020-06-21 | Discharge: 2020-06-21 | Disposition: A | Discharge status: Home / Self Care | Payer: Medicare Other | Source: Ambulatory Visit | Attending: Pulmonary Disease | Admitting: Pulmonary Disease

## 2020-06-21 DIAGNOSIS — J432 Centrilobular emphysema: Secondary | ICD-10-CM

## 2020-06-21 DIAGNOSIS — R0602 Shortness of breath: Secondary | ICD-10-CM | POA: Diagnosis not present

## 2020-06-21 NOTE — Progress Notes (Signed)
Daily Session Note  Patient Details  Name: Heidi Ramirez MRN: [id number] Date of Birth: [birth year] Referring Provider:   April Manson Pulmonary Rehab Walk Test from 05/04/2020 in [place]  Referring Provider Dr. Elsworth Soho      Encounter Date: 06/21/2020  Check In:  Session Check In - 06/21/20 1110      Check-In   Supervising physician immediately available to respond to emergencies [facility] immediately available    Physician(s) Dr. Tawanna Solo    [facility]    Staff Present Rosebud Poles, RN, BSN;Lisa Ysidro Evert, RN;Jessica Hassell Done, MS, ACSM-CEP, Exercise Physiologist    Virtual Visit No    Medication changes reported     No    Fall or balance concerns reported    No    Tobacco Cessation No Change    Warm-up and Cool-down Performed on first and last piece of equipment    Resistance Training Performed Yes    VAD Patient? No    PAD/SET Patient? No      Pain Assessment   Currently in Pain? No/denies    Multiple Pain Sites No           Capillary Blood Glucose: No results found for this or any previous visit (from the past 24 hour(s)).    Social History   Tobacco Use  Smoking Status Former Smoker  . Packs/day: 0.25  . Years: 59.00  . Pack years: 14.75  . Types: Cigarettes  . Start date: 1961  Smokeless Tobacco Never Used  Tobacco Comment   Quit end of july 2021    Goals Met:  Proper associated with RPD/PD & O2 Sat Exercise tolerated well Strength training completed today  Goals Unmet:  Not Applicable  Comments: Service time is from 1012 to [place]    Dr. Fransico Him is Medical Director for Cardiac Rehab at [facility].

## 2020-06-26 ENCOUNTER — Other Ambulatory Visit: Payer: Self-pay

## 2020-06-26 ENCOUNTER — Encounter (HOSPITAL_COMMUNITY)
Admission: RE | Admit: 2020-06-26 | Discharge: 2020-06-26 | Disposition: A | Discharge status: Home / Self Care | Payer: Medicare Other | Source: Ambulatory Visit | Attending: Pulmonary Disease | Admitting: Pulmonary Disease

## 2020-06-26 DIAGNOSIS — J432 Centrilobular emphysema: Secondary | ICD-10-CM

## 2020-06-26 DIAGNOSIS — R0602 Shortness of breath: Secondary | ICD-10-CM | POA: Diagnosis not present

## 2020-06-26 NOTE — Progress Notes (Signed)
Pulmonary Individual Treatment Plan  Patient Details  Name: JESLIN BAZINET MRN: [id number] Date of Birth: [birth year] Referring Provider:   April Manson Pulmonary Rehab Walk Test from 05/04/2020 in [place]  Referring Provider Dr. Elsworth Soho      Initial Encounter Date:  Flowsheet Row Pulmonary Rehab Walk Test from 05/04/2020 in [place]  Date 05/04/20      Visit Diagnosis: Centrilobular emphysema ([place])  Patient's Home Medications on Admission:   Current Outpatient Medications:  .  albuterol (VENTOLIN HFA) 108 (90 Base) MCG/ACT inhaler, Inhale 2 puffs into the lungs every 6 (six) hours as needed for wheezing or shortness of breath., Disp: 8 g, Rfl: 1 .  amLODipine (NORVASC) 5 MG tablet, Take 1 tablet (5 mg total) by mouth daily., Disp: 30 tablet, Rfl: 0 .  Fluticasone-Umeclidin-Vilant (TRELEGY ELLIPTA) 200-62.5-25 MCG/INH AEPB, Inhale 1 puff into the lungs daily., Disp: 60 each, Rfl: 6 .  Spacer/Aero-Holding Chambers (AEROCHAMBER MV) inhaler, Use as instructed (Patient taking differently: 1 each by Other route as directed. ), Disp: 1 each, Rfl: 0  Past Medical History: Past Medical History:  Diagnosis Date  . Heart murmur    since birth  . HTN (hypertension)   . 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 Packs/day: 0.25  . Years: 59.00  . Pack years: 14.75  . Types: Cigarettes  . Start date: 1961  Smokeless Tobacco Never Used  Tobacco Comment   Quit end of july 2021    Labs: Recent Review Flowsheet Data   There is no flowsheet data to display.     Capillary Blood Glucose: No results found for: GLUCAP   Pulmonary Assessment Scores:  Pulmonary Assessment Scores    Row Name 05/04/20 1026         ADL UCSD   ADL Phase Entry     SOB Score total 4           CAT Score   CAT Score 4           mMRC Score   mMRC Score 1            UCSD: Self-administered rating of dyspnea associated with activities of daily living (ADLs) 6-point scale (0 = "not at all" to 5 = "maximal or unable to do because of breathlessness")  Scoring Scores range from 0 to 120.  Minimally important difference is 5 units  CAT: CAT can identify the health impairment of COPD patients and is better correlated with disease progression.  CAT has a scoring range of zero to 40. The CAT score is classified into four groups of low (less than 10), medium (10 - 20), high (21-30) and very high (31-40) based on the impact level of disease on health status. A CAT score over 10 suggests significant symptoms.  A worsening CAT score could be explained by an exacerbation, poor medication adherence, poor inhaler technique, or progression of COPD or comorbid conditions.  CAT MCID is 2 points  mMRC: mMRC (Modified Medical Research Council) Dyspnea Scale is used to assess the degree of baseline functional disability in patients of respiratory disease due to dyspnea. No minimal important difference is established. A decrease in score of 1 point or greater is considered a positive change.   Pulmonary Function Assessment:  Pulmonary Function Assessment - 05/04/20 0920      Breath   Shortness  of Breath Yes;Limiting activity           Exercise Target Goals: Exercise Program Goal: Individual exercise prescription set using results from initial 6 min walk test and THRR while considering  patient's activity barriers and safety.   Exercise Prescription Goal: Initial exercise prescription builds to 30-45 minutes a day of aerobic activity, 2-3 days per week.  Home exercise guidelines will be given to patient during program as part of exercise prescription that the participant will acknowledge.  Activity Barriers & Risk Stratification:  Activity Barriers & Cardiac Risk Stratification - 05/04/20 0919      Activity Barriers & Cardiac Risk Stratification   Activity  Barriers Shortness of Breath;Deconditioning    Cardiac Risk Stratification Low           6 Minute Walk:  6 Minute Walk    Row Name 05/04/20 1230         6 Minute Walk   Phase Initial     Distance 1393 feet     Walk Time 6 minutes     # of Rest Breaks 0     MPH 2.64     METS 3.64     RPE 9     Perceived Dyspnea  1     VO2 Peak 12.74     Symptoms No     Resting HR 78 bpm     Resting BP 142/72     Resting Oxygen Saturation  95 %     Exercise Oxygen Saturation  during 6 min walk 92 %     Max Ex. HR 109 bpm     Max Ex. BP 172/60     2 Minute Post BP 150/64           Interval HR   1 Minute HR 104     2 Minute HR 102     3 Minute HR 109     4 Minute HR 107     5 Minute HR 106     6 Minute HR 90     2 Minute Post HR 81     Interval Heart Rate? Yes           Interval Oxygen   Interval Oxygen? Yes     Baseline Oxygen Saturation % 95 %     1 Minute Oxygen Saturation % 93 %     1 Minute Liters of Oxygen 0 L     2 Minute Oxygen Saturation % 92 %     2 Minute Liters of Oxygen 0 L     3 Minute Oxygen Saturation % 93 %     3 Minute Liters of Oxygen 0 L     4 Minute Oxygen Saturation % 94 %     4 Minute Liters of Oxygen 0 L     5 Minute Oxygen Saturation % 94 %     5 Minute Liters of Oxygen 0 L     6 Minute Oxygen Saturation % 95 %     6 Minute Liters of Oxygen 0 L     2 Minute Post Oxygen Saturation % 97 %     2 Minute Post Liters of Oxygen 0 L            Oxygen Initial Assessment:  Oxygen Initial Assessment - 05/04/20 0920      Home Oxygen   Home Oxygen Device None    Sleep Oxygen Prescription None    Home Exercise Oxygen Prescription None  Home Resting Oxygen Prescription None      Initial 6 min Walk   Oxygen Used None      Program Oxygen Prescription   Program Oxygen Prescription None      Intervention   Short Term Goals To learn and understand importance of monitoring SPO2 with pulse oximeter and demonstrate accurate use of the pulse  oximeter.;To learn and understand importance of maintaining oxygen saturations>88%;To learn and demonstrate proper pursed lip breathing techniques or other breathing techniques.;To learn and demonstrate proper use of respiratory medications    Long  Term Goals Verbalizes importance of monitoring SPO2 with pulse oximeter and return demonstration;Maintenance of O2 saturations>88%;Exhibits proper breathing techniques, such as pursed lip breathing or other method taught during program session;Compliance with respiratory medication;Demonstrates proper use of MDI's           Oxygen Re-Evaluation:  Oxygen Re-Evaluation    Row Name 05/08/20 0919 05/28/20 0923 06/26/20 0806         Program Oxygen Prescription   Program Oxygen Prescription None None None           Home Oxygen   Home Oxygen Device None None None     Sleep Oxygen Prescription None None None     Home Exercise Oxygen Prescription None None None     Home Resting Oxygen Prescription None None None           Goals/Expected Outcomes   Short Term Goals To learn and understand importance of monitoring SPO2 with pulse oximeter and demonstrate accurate use of the pulse oximeter.;To learn and understand importance of maintaining oxygen saturations>88%;To learn and demonstrate proper pursed lip breathing techniques or other breathing techniques.;To learn and demonstrate proper use of respiratory medications To learn and understand importance of monitoring SPO2 with pulse oximeter and demonstrate accurate use of the pulse oximeter.;To learn and understand importance of maintaining oxygen saturations>88%;To learn and demonstrate proper pursed lip breathing techniques or other breathing techniques.;To learn and demonstrate proper use of respiratory medications To learn and understand importance of monitoring SPO2 with pulse oximeter and demonstrate accurate use of the pulse oximeter.;To learn and understand importance of maintaining oxygen  saturations>88%;To learn and demonstrate proper pursed lip breathing techniques or other breathing techniques.;To learn and demonstrate proper use of respiratory medications     Long  Term Goals Verbalizes importance of monitoring SPO2 with pulse oximeter and return demonstration;Maintenance of O2 saturations>88%;Exhibits proper breathing techniques, such as pursed lip breathing or other method taught during program session;Compliance with respiratory medication;Demonstrates proper use of MDI's Verbalizes importance of monitoring SPO2 with pulse oximeter and return demonstration;Maintenance of O2 saturations>88%;Exhibits proper breathing techniques, such as pursed lip breathing or other method taught during program session;Compliance with respiratory medication;Demonstrates proper use of MDI's Verbalizes importance of monitoring SPO2 with pulse oximeter and return demonstration;Maintenance of O2 saturations>88%;Exhibits proper breathing techniques, such as pursed lip breathing or other method taught during program session;Compliance with respiratory medication;Demonstrates proper use of MDI's     Comments -- Patient is compliant with monitoring oxygen saturation and performing pursed lip breathing. Patient is compliant with monitoring oxygen saturation and performing pursed lip breathing.     Goals/Expected Outcomes Compliance and understanding of oxygen saturation and pursed lip breathing. Compliance and understanding of oxygen saturation and pursed lip breathing. Compliance and understanding of oxygen saturation and pursed lip breathing.            Oxygen Discharge (Final Oxygen Re-Evaluation):  Oxygen Re-Evaluation - 06/26/20 2023  Program Oxygen Prescription   Program Oxygen Prescription None      Home Oxygen   Home Oxygen Device None    Sleep Oxygen Prescription None    Home Exercise Oxygen Prescription None    Home Resting Oxygen Prescription None      Goals/Expected Outcomes   Short  Term Goals To learn and understand importance of monitoring SPO2 with pulse oximeter and demonstrate accurate use of the pulse oximeter.;To learn and understand importance of maintaining oxygen saturations>88%;To learn and demonstrate proper pursed lip breathing techniques or other breathing techniques.;To learn and demonstrate proper use of respiratory medications    Long  Term Goals Verbalizes importance of monitoring SPO2 with pulse oximeter and return demonstration;Maintenance of O2 saturations>88%;Exhibits proper breathing techniques, such as pursed lip breathing or other method taught during program session;Compliance with respiratory medication;Demonstrates proper use of MDI's    Comments Patient is compliant with monitoring oxygen saturation and performing pursed lip breathing.    Goals/Expected Outcomes Compliance and understanding of oxygen saturation and pursed lip breathing.           Initial Exercise Prescription:  Initial Exercise Prescription - 05/04/20 1400      Date of Initial Exercise RX and Referring Provider   Date 05/04/20    Referring Provider Dr. Vassie Loll    Expected Discharge Date 07/05/20      Treadmill   MPH 2.2    Grade 0    Minutes 15      NuStep   Level 2    SPM 80    Minutes 15      Prescription Details   Frequency (times per week) 2    Duration Progress to 45 minutes of aerobic exercise without signs/symptoms of physical distress      Intensity   THRR 40-80% of Max Heartrate 61-122    Ratings of Perceived Exertion 11-13    Perceived Dyspnea 0-4      Progression   Progression Continue to progress workloads to maintain intensity without signs/symptoms of physical distress.      Resistance Training   Training Prescription Yes    Weight Blue bands    Reps 10-15           Perform Capillary Blood Glucose checks as needed.  Exercise Prescription Changes:  Exercise Prescription Changes    Row Name 05/08/20 1100 05/22/20 1100 05/28/20 0900  05/31/20 1200 06/05/20 1100     Response to Exercise   Blood Pressure (Admit) 152/70 151/84 -- -- 126/80   Blood Pressure (Exercise) 130/62 164/62 -- -- 170/60   Blood Pressure (Exit) 142/52 130/62 -- -- 130/52   Heart Rate (Admit) 79 bpm 59 bpm -- -- 64 bpm   Heart Rate (Exercise) 111 bpm 105 bpm -- -- 120 bpm   Heart Rate (Exit) 84 bpm 68 bpm -- -- 71 bpm   Oxygen Saturation (Admit) 99 % 97 % -- -- 98 %   Oxygen Saturation (Exercise) 93 % 94 % -- -- 94 %   Oxygen Saturation (Exit) 95 % 94 % -- -- 94 %   Rating of Perceived Exertion (Exercise) 11 12 -- -- 8   Perceived Dyspnea (Exercise) 0 1 -- -- 1   Duration Continue with 30 min of aerobic exercise without signs/symptoms of physical distress. Continue with 30 min of aerobic exercise without signs/symptoms of physical distress. -- -- Continue with 30 min of aerobic exercise without signs/symptoms of physical distress.   Intensity Other (comment)  40-80% of HRR  THRR unchanged -- -- THRR unchanged     Progression   Progression Continue to progress workloads to maintain intensity without signs/symptoms of physical distress. Continue to progress workloads to maintain intensity without signs/symptoms of physical distress. -- -- --   Average METs -- -- 4.3 -- --     Resistance Training   Training Prescription Yes Yes -- -- Yes   Weight blue bands blue bands -- -- blue bands   Reps 10-15 10-15 -- -- 10-15   Time 10 Minutes 10 Minutes -- -- 10 Minutes     Treadmill   MPH 1.6 2.1 -- -- 2.3   Grade 0 1 -- -- 2.5   Minutes 15 15 -- -- 15     NuStep   Level 2 3 -- -- 4   SPM 80 80 -- -- 80   Minutes 15 15 -- -- 15   METs 2.1 2.8 -- -- 3.1     Home Exercise Plan   Plans to continue exercise at -- -- -- Home (comment)  Pt does Virtual yoga and strength training at home --   Frequency -- -- -- Add 3 additional days to program exercise sessions. --   Initial Home Exercises Provided -- -- -- 05/31/20 --   [place] Name 06/19/20 1100              Response to Exercise   Blood Pressure (Admit) 120/80       Blood Pressure (Exercise) 152/70       Blood Pressure (Exit) 100/50       Heart Rate (Admit) 63 bpm       Heart Rate (Exercise) 112 bpm       Heart Rate (Exit) 68 bpm       Oxygen Saturation (Admit) 97 %       Oxygen Saturation (Exercise) 94 %       Oxygen Saturation (Exit) 94 %       Rating of Perceived Exertion (Exercise) 8       Perceived Dyspnea (Exercise) 1       Duration Continue with 30 min of aerobic exercise without signs/symptoms of physical distress.       Intensity THRR unchanged               Progression   Progression Continue to progress workloads to maintain intensity without signs/symptoms of physical distress.               Resistance Training   Training Prescription Yes       Weight blue bands       Reps 10-15       Time 10 Minutes               Treadmill   MPH 2.7       Grade 2.5       Minutes 15               NuStep   Level 5       SPM 80       Minutes 15       METs 3.6              Exercise Comments:  Exercise Comments    Row Name 05/08/20 1211 05/31/20 1219         Exercise Comments Patient completed first day of exercise and tolerated well with no complaints or concerns. She was able to do 30 minutes of cardio with no breaks. She  also completed all resistance band exercises with no concerns. She stated she was a little fatigued after all of it but no pain. Completed home exercise with patient. Patient currently exercises at home a few days a week. She does virtual yoga and some strength training. We discussed ways to make progressions with home exercise. Pt was receptive. Will continue to follow up with home exercise prescription.             Exercise Goals and Review:  Exercise Goals    Row Name 05/04/20 1017             Exercise Goals   Increase Physical Activity Yes       Intervention Provide advice, education, support and counseling about physical  activity/exercise needs.;Develop an individualized exercise prescription for aerobic and resistive training based on initial evaluation findings, risk stratification, comorbidities and participant's personal goals.       Expected Outcomes Short Term: Attend rehab on a regular basis to increase amount of physical activity.;Long Term: Add in home exercise to make exercise part of routine and to increase amount of physical activity.;Long Term: Exercising regularly at least 3-5 days a week.       Increase Strength and Stamina Yes       Intervention Provide advice, education, support and counseling about physical activity/exercise needs.;Develop an individualized exercise prescription for aerobic and resistive training based on initial evaluation findings, risk stratification, comorbidities and participant's personal goals.       Expected Outcomes Short Term: Increase workloads from initial exercise prescription for resistance, speed, and METs.;Short Term: Perform resistance training exercises routinely during rehab and add in resistance training at home;Long Term: Improve cardiorespiratory fitness, muscular endurance and strength as measured by increased METs and functional capacity (6MWT)       Able to understand and use rate of perceived exertion (RPE) scale Yes       Intervention Provide education and explanation on how to use RPE scale       Expected Outcomes Short Term: Able to use RPE daily in rehab to express subjective intensity level;Long Term:  Able to use RPE to guide intensity level when exercising independently       Able to understand and use Dyspnea scale Yes       Intervention Provide education and explanation on how to use Dyspnea scale       Expected Outcomes Short Term: Able to use Dyspnea scale daily in rehab to express subjective sense of shortness of breath during exertion;Long Term: Able to use Dyspnea scale to guide intensity level when exercising independently       Knowledge and  understanding of Target Heart Rate Range (THRR) Yes       Intervention Provide education and explanation of THRR including how the numbers were predicted and where they are located for reference       Expected Outcomes Short Term: Able to state/look up THRR;Long Term: Able to use THRR to govern intensity when exercising independently;Short Term: Able to use daily as guideline for intensity in rehab       Understanding of Exercise Prescription Yes       Intervention Provide education, explanation, and written materials on patient's individual exercise prescription       Expected Outcomes Short Term: Able to explain program exercise prescription;Long Term: Able to explain home exercise prescription to exercise independently              Exercise Goals Re-Evaluation :  Exercise Goals  Re-Evaluation    Row Name 05/08/20 0919 05/28/20 0920 06/26/20 0802         Exercise Goal Re-Evaluation   Exercise Goals Review Increase Physical Activity;Increase Strength and Stamina;Able to understand and use rate of perceived exertion (RPE) scale;Able to understand and use Dyspnea scale;Knowledge and understanding of Target Heart Rate Range (THRR);Understanding of Exercise Prescription Increase Physical Activity;Increase Strength and Stamina;Able to understand and use rate of perceived exertion (RPE) scale;Able to understand and use Dyspnea scale;Knowledge and understanding of Target Heart Rate Range (THRR);Understanding of Exercise Prescription Increase Physical Activity;Increase Strength and Stamina;Able to understand and use rate of perceived exertion (RPE) scale;Able to understand and use Dyspnea scale;Knowledge and understanding of Target Heart Rate Range (THRR);Understanding of Exercise Prescription     Comments Patient's first day of exercise is today. It is too early to see any progression. Will continue to monitor and progress as she is able. Nonie has completed 6 exercise sessions and has tolerated exercise  very well. She is reaching the point of being independent with all exercises and is making progressions with workload increases and MET level increases. She is exercising at 2.7 METS on the Nustep and 4.6 METS on the treadmill. Will continue to monitor and progress as she is able. Latia has completed 14 exercise sessions and has done very well in the program. She is scheduled to graduate in 2 weeks. Lately she has been going above THRR and we have requested a THR increase from her doctor. She has mad egreat progression with workload increases and MET increases. She is independent with all exercises. She is exercising at 4.1 METS on level 6 on the Nustep. Will continue to monitor and progress as she is able. Will also follow up on exercise plan after graduation.     Expected Outcomes Through exercise at rehab and home the patient will decrease shortness of breath with daily activities and feel confident in carrying out an exercise regimn at home. Through exercise at rehab and home the patient will decrease shortness of breath with daily activities and feel confident in carrying out an exercise regimn at home. Through exercise at rehab and home the patient will decrease shortness of breath with daily activities and feel confident in carrying out an exercise regimn at home.            Discharge Exercise Prescription (Final Exercise Prescription Changes):  Exercise Prescription Changes - 06/19/20 1100      Response to Exercise   Blood Pressure (Admit) 120/80    Blood Pressure (Exercise) 152/70    Blood Pressure (Exit) 100/50    Heart Rate (Admit) 63 bpm    Heart Rate (Exercise) 112 bpm    Heart Rate (Exit) 68 bpm    Oxygen Saturation (Admit) 97 %    Oxygen Saturation (Exercise) 94 %    Oxygen Saturation (Exit) 94 %    Rating of Perceived Exertion (Exercise) 8    Perceived Dyspnea (Exercise) 1    Duration Continue with 30 min of aerobic exercise without signs/symptoms of physical distress.     Intensity THRR unchanged      Progression   Progression Continue to progress workloads to maintain intensity without signs/symptoms of physical distress.      Resistance Training   Training Prescription Yes    Weight blue bands    Reps 10-15    Time 10 Minutes      Treadmill   MPH 2.7    Grade 2.5    Minutes  15      NuStep   Level 5    SPM 80    Minutes 15    METs 3.6           Nutrition:  Target Goals: Understanding of nutrition guidelines, daily intake of sodium '1500mg'$ , cholesterol '200mg'$ , calories 30% from fat and 7% or less from saturated fats, daily to have 5 or more servings of fruits and vegetables.  Biometrics:  Pre Biometrics - 05/04/20 1018      Pre Biometrics   Grip Strength 18 kg            Nutrition Therapy Plan and Nutrition Goals:  Nutrition Therapy & Goals - 05/17/20 1124      Nutrition Therapy   Diet Generally Healthful      Personal Nutrition Goals   Nutrition Goal Pt to identify food quantities necessary to achieve weight loss of 4-8 lb at graduation from pulmonary rehab.    Personal Goal #2 Pt to swap out processed snacks for more healthful snacks      Intervention Plan   Intervention Prescribe, educate and counsel regarding individualized specific dietary modifications aiming towards targeted core components such as weight, hypertension, lipid management, diabetes, heart failure and other comorbidities.;Nutrition handout(s) given to patient.    Expected Outcomes Short Term Goal: A plan has been developed with personal nutrition goals set during dietitian appointment.;Long Term Goal: Adherence to prescribed nutrition plan.           Nutrition Assessments:  MEDIFICTS Score Key:  ?70 Need to make dietary changes   40-70 Heart Healthy Diet  ? 40 Therapeutic Level Cholesterol Diet  Flowsheet Row PULMONARY REHAB OTHER RESPIRATORY from 05/17/2020 in [facility]  Picture Your Plate Total Score on  Admission 59     Picture Your Plate Scores:  <65 Unhealthy dietary pattern with much room for improvement.  41-50 Dietary pattern unlikely to meet recommendations for good health and room for improvement.  51-60 More healthful dietary pattern, with some room for improvement.   >60 Healthy dietary pattern, although there may be some specific behaviors that could be improved.    Nutrition Goals Re-Evaluation:  Nutrition Goals Re-Evaluation    [place] Name 05/17/20 1125 05/24/20 0849 06/20/20 1447         Goals   Current Weight 160 lb (72.6 kg) 162 lb 4.1 oz (73.6 kg) 159 lb 6.3 oz (72.3 kg)     Nutrition Goal Pt to identify food quantities necessary to achieve weight loss of 4-8 lb at graduation from pulmonary rehab. -- Pt to identify food quantities necessary to achieve weight loss of 4-8 lb at graduation from pulmonary rehab.     Comment -- -- 3 lb weight loss.     Expected Outcome Weight loss -- Weight loss           Personal Goal #2 Re-Evaluation   Personal Goal #2 Pt to swap out processed snacks for more healthful snacks -- Pt to swap out processed snacks for more healthful snacks            Nutrition Goals Discharge (Final Nutrition Goals Re-Evaluation):  Nutrition Goals Re-Evaluation - 06/20/20 1447      Goals   Current Weight 159 lb 6.3 oz (72.3 kg)    Nutrition Goal Pt to identify food quantities necessary to achieve weight loss of 4-8 lb at graduation from pulmonary rehab.    Comment 3 lb weight loss.    Expected Outcome  Weight loss      Personal Goal #2 Re-Evaluation   Personal Goal #2 Pt to swap out processed snacks for more healthful snacks           Psychosocial: Target Goals: Acknowledge presence or absence of significant depression and/or stress, maximize coping skills, provide positive support system. Participant is able to verbalize types and ability to use techniques and skills needed for reducing stress and depression.  Initial Review & Psychosocial  Screening:  Initial Psych Review & Screening - 05/04/20 1019      Family Dynamics   Comments Widowed, husband passed away 36 years ago, but she has other family that is local and sees often      Screening Interventions   Expected Outcomes Long Term Goal: Stressors or current issues are controlled or eliminated.           Quality of Life Scores:  Scores of 19 and below usually indicate a poorer quality of life in these areas.  A difference of  2-3 points is a clinically meaningful difference.  A difference of 2-3 points in the total score of the Quality of Life Index has been associated with significant improvement in overall quality of life, self-image, physical symptoms, and general health in studies assessing change in quality of life.  PHQ-9: Recent Review Flowsheet Data    Depression screen [facility] 2/9 05/04/2020   Decreased Interest 0   Down, Depressed, Hopeless 0   PHQ - 2 Score 0   Altered sleeping 0   Tired, decreased energy 0   Change in appetite 0   Feeling bad or failure about yourself  0   Trouble concentrating 0   Moving slowly or fidgety/restless 0   Suicidal thoughts 0   Difficult doing work/chores Not difficult at all     Interpretation of Total Score  Total Score Depression Severity:  1-4 = Minimal depression, 5-9 = Mild depression, 10-14 = Moderate depression, 15-19 = Moderately severe depression, 20-27 = Severe depression   Psychosocial Evaluation and Intervention:   Psychosocial Re-Evaluation:  Psychosocial Re-Evaluation    [place] Name 05/07/20 1214 05/07/20 1215 05/24/20 0920 06/25/20 1143       Psychosocial Re-Evaluation   Current issues with Current Stress Concerns Current Stress Concerns None Identified None Identified    Comments -- Ashlee will begin exercising in pulmonary rehab tomorrow, no change in psychosocial status since orientation. No concerns identified No psychsocial concerns identified at this time, has a positive attitude, has a supportive  family.    Expected Outcomes -- For Nahiara to handle her stress in healthy manners. To continue to have no psychsocial concerns while in pulmonary rehab. For Momina to continue to handle stress in healthy ways and continue to be free of psychosocial concerns while participating in pulmonary rehab.    Interventions -- Stress management education;Encouraged to attend Pulmonary Rehabilitation for the exercise Encouraged to attend Pulmonary Rehabilitation for the exercise Encouraged to attend Pulmonary Rehabilitation for the exercise    Continue Psychosocial Services  -- No Follow up required No Follow up required No Follow up required           Psychosocial Discharge (Final Psychosocial Re-Evaluation):  Psychosocial Re-Evaluation - 06/25/20 1143      Psychosocial Re-Evaluation   Current issues with None Identified    Comments No psychsocial concerns identified at this time, has a positive attitude, has a supportive family.    Expected Outcomes For Janea to continue to handle stress in healthy ways  and continue to be free of psychosocial concerns while participating in pulmonary rehab.    Interventions Encouraged to attend Pulmonary Rehabilitation for the exercise    Continue Psychosocial Services  No Follow up required           Education: Education Goals: Education classes will be provided on a weekly basis, covering required topics. Participant will state understanding/return demonstration of topics presented.  Learning Barriers/Preferences:  Learning Barriers/Preferences - 05/04/20 0923      Learning Barriers/Preferences   Learning Barriers None    Learning Preferences Written Material;Audio           Education Topics: Risk Factor Reduction:  -Group instruction that is supported by a PowerPoint presentation. Instructor discusses the definition of a risk factor, different risk factors for pulmonary disease, and how the heart and lungs work together.     Nutrition for Pulmonary  Patient:  -Group instruction provided by PowerPoint slides, verbal discussion, and written materials to support subject matter. The instructor gives an explanation and review of healthy diet recommendations, which includes a discussion on weight management, recommendations for fruit and vegetable consumption, as well as protein, fluid, caffeine, fiber, sodium, sugar, and alcohol. Tips for eating when patients are short of breath are discussed. Flowsheet Row PULMONARY REHAB OTHER RESPIRATORY from 06/07/2020 in [place]  Date 05/22/20  Educator handout      Pursed Lip Breathing:  -Group instruction that is supported by demonstration and informational handouts. Instructor discusses the benefits of pursed lip and diaphragmatic breathing and detailed demonstration on how to preform both.     Oxygen Safety:  -Group instruction provided by PowerPoint, verbal discussion, and written material to support subject matter. There is an overview of "What is Oxygen" and "Why do we need it".  Instructor also reviews how to create a safe environment for oxygen use, the importance of using oxygen as prescribed, and the risks of noncompliance. There is a brief discussion on traveling with oxygen and resources the patient may utilize. Flowsheet Row PULMONARY REHAB OTHER RESPIRATORY from 06/07/2020 in [place]  Date 05/31/20  Educator Handout      Oxygen Equipment:  -Group instruction provided by [facility] Staff utilizing handouts, written materials, and equipment demonstrations.   Signs and Symptoms:  -Group instruction provided by written material and verbal discussion to support subject matter. Warning signs and symptoms of infection, stroke, and heart attack are reviewed and when to call the physician/911 reinforced. Tips for preventing the spread of infection discussed.   Advanced Directives:  -Group instruction provided by verbal  instruction and written material to support subject matter. Instructor reviews Advanced Directive laws and proper instruction for filling out document.   Pulmonary Video:  -Group video education that reviews the importance of medication and oxygen compliance, exercise, good nutrition, pulmonary hygiene, and pursed lip and diaphragmatic breathing for the pulmonary patient.   Exercise for the Pulmonary Patient:  -Group instruction that is supported by a PowerPoint presentation. Instructor discusses benefits of exercise, core components of exercise, frequency, duration, and intensity of an exercise routine, importance of utilizing pulse oximetry during exercise, safety while exercising, and options of places to exercise outside of rehab.     Pulmonary Medications:  -Verbally interactive group education provided by instructor with focus on inhaled medications and proper administration.   Anatomy and Physiology of the Respiratory System and Intimacy:  -Group instruction provided by PowerPoint, verbal discussion, and written material to support subject  matter. Instructor reviews respiratory cycle and anatomical components of the respiratory system and their functions. Instructor also reviews differences in obstructive and restrictive respiratory diseases with examples of each. Intimacy, Sex, and Sexuality differences are reviewed with a discussion on how relationships can change when diagnosed with pulmonary disease. Common sexual concerns are reviewed. Flowsheet Row PULMONARY REHAB OTHER RESPIRATORY from 06/07/2020 in [place]  Date 06/07/20  Educator Bufford Lope  Instruction Review Code 1- Verbalizes Understanding      MD DAY -A group question and answer session with a medical doctor that allows participants to ask questions that relate to their pulmonary disease state.   OTHER EDUCATION -Group or individual verbal, written, or video instructions that  support the educational goals of the pulmonary rehab program. [place] from 06/07/2020 in [place]  Date --  [MET [place]  Educator Handout-Jessica  Instruction Review Code 1- Verbalizes Understanding      Holiday Eating Survival Tips:  -Group instruction provided by PowerPoint slides, verbal discussion, and written materials to support subject matter. The instructor gives patients tips, tricks, and techniques to help them not only survive but enjoy the holidays despite the onslaught of food that accompanies the holidays.   Knowledge Questionnaire Score:  Knowledge Questionnaire Score - 05/04/20 1025      Knowledge Questionnaire Score   Pre Score 16/18           Core Components/Risk Factors/Patient Goals at Admission:  Personal Goals and Risk Factors at Admission - 05/04/20 1020      Core Components/Risk Factors/Patient Goals on Admission    Weight Management Weight Loss    Improve shortness of breath with ADL's Yes    Intervention Provide education, individualized exercise plan and daily activity instruction to help decrease symptoms of SOB with activities of daily living.    Expected Outcomes Short Term: Improve cardiorespiratory fitness to achieve a reduction of symptoms when performing ADLs;Long Term: Be able to perform more ADLs without symptoms or delay the onset of symptoms    Hypertension Yes    Intervention Provide education on lifestyle modifcations including regular physical activity/exercise, weight management, moderate sodium restriction and increased consumption of fresh fruit, vegetables, and low fat dairy, alcohol moderation, and smoking cessation.    Expected Outcomes Long Term: Maintenance of blood pressure at goal levels.           Core Components/Risk Factors/Patient Goals Review:   Goals and Risk Factor Review    Row Name 05/07/20 1217 05/24/20 0920 06/25/20 1145         Core  Components/Risk Factors/Patient Goals Review   Personal Goals Review Increase knowledge of respiratory medications and ability to use respiratory devices properly.;Improve shortness of breath with ADL's;Develop more efficient breathing techniques such as purse lipped breathing and diaphragmatic breathing and practicing self-pacing with activity. Increase knowledge of respiratory medications and ability to use respiratory devices properly.;Improve shortness of breath with ADL's;Develop more efficient breathing techniques such as purse lipped breathing and diaphragmatic breathing and practicing self-pacing with activity. Develop more efficient breathing techniques such as purse lipped breathing and diaphragmatic breathing and practicing self-pacing with activity.;Increase knowledge of respiratory medications and ability to use respiratory devices properly.;Improve shortness of breath with ADL's     Review Katharin will begin exercising in pulmonary rehab tomorrow, 05/08/2020.  We will assist Jaidon in meeting these goals. Her attendance has been excellent, is progressing well with her exercise. [employer] in  2 weeks, and has made great progress with increasing her exercise workloads.  Presently in acheiving 4.1 mets on the nustep, and 2 mph with 2.5 incline on the treadmill. Her shortness of breath is improving.     Expected Outcomes See admission goals. See admission goals. See admission goals.            Core Components/Risk Factors/Patient Goals at Discharge (Final Review):   Goals and Risk Factor Review - 06/25/20 1145      Core Components/Risk Factors/Patient Goals Review   Personal Goals Review Develop more efficient breathing techniques such as purse lipped breathing and diaphragmatic breathing and practicing self-pacing with activity.;Increase knowledge of respiratory medications and ability to use respiratory devices properly.;Improve shortness of breath with ADL's    Review Juliann Pulse graduates in 2  weeks, and has made great progress with increasing her exercise workloads.  Presently in acheiving 4.1 mets on the nustep, and 2 mph with 2.5 incline on the treadmill. Her shortness of breath is improving.    Expected Outcomes See admission goals.           ITP Comments:   Comments: ITP REVIEW Pt is making expected progress toward pulmonary rehab goals after completing 14 sessions. Recommend continued exercise, life style modification, education, and utilization of breathing techniques to increase stamina and strength and decrease shortness of breath with exertion.

## 2020-06-26 NOTE — Progress Notes (Signed)
Daily Session Note  Patient Details  Name: Heidi Ramirez MRN: [id number] Date of Birth: 12/28/1950 Referring Provider:   April Manson Pulmonary Rehab Walk Test from 05/04/2020 in [place]  Referring Provider Dr. Elsworth Soho      Encounter Date: 06/26/2020  Check In:  Session Check In - 06/26/20 1101      Check-In   Supervising physician immediately available to respond to emergencies [facility] immediately available    Physician(s) Dr. Tawanna Solo    Location [facility]    Staff Present Rosebud Poles, RN, BSN;Lisa Ysidro Evert, RN;Jessica Hassell Done, MS, ACSM-CEP, Exercise Physiologist    Virtual Visit No    Medication changes reported     No    Fall or balance concerns reported    No    Tobacco Cessation No Change    Warm-up and Cool-down Performed on first and last piece of equipment    Resistance Training Performed Yes    VAD Patient? No    PAD/SET Patient? No      Pain Assessment   Currently in Pain? No/denies    Multiple Pain Sites No           Capillary Blood Glucose: No results found for this or any previous visit (from the past 24 hour(s)).    Social History   Tobacco Use  Smoking Status Former Smoker  . Packs/day: 0.25  . Years: 59.00  . Pack years: 14.75  . Types: Cigarettes  . Start date: 1961  Smokeless Tobacco Never Used  Tobacco Comment   Quit end of july 2021    Goals Met:  Proper associated with RPD/PD & O2 Sat Exercise tolerated well Strength training completed today  Goals Unmet:  Not Applicable  Comments: Service time is from 1010 to 1115    Dr. Fransico Him is Medical Director for Cardiac Rehab at [facility].

## 2020-06-28 ENCOUNTER — Encounter (HOSPITAL_COMMUNITY)
Admission: RE | Admit: 2020-06-28 | Discharge: 2020-06-28 | Disposition: A | Discharge status: Home / Self Care | Payer: Medicare Other | Source: Ambulatory Visit | Attending: Pulmonary Disease | Admitting: Pulmonary Disease

## 2020-06-28 ENCOUNTER — Other Ambulatory Visit: Payer: Self-pay

## 2020-06-28 DIAGNOSIS — J432 Centrilobular emphysema: Secondary | ICD-10-CM

## 2020-06-28 DIAGNOSIS — R0602 Shortness of breath: Secondary | ICD-10-CM | POA: Diagnosis not present

## 2020-06-28 NOTE — Progress Notes (Signed)
Daily Session Note  Patient Details  Name: Heidi Ramirez MRN: [id number] Date of Birth: Aug 10, 1950 Referring Provider:   April Manson Pulmonary Rehab Walk Test from 05/04/2020 in [place]  Referring Provider Dr. Elsworth Soho      Encounter Date: 06/28/2020  Check In:  Session Check In - 06/28/20 1101      Check-In   Supervising physician immediately available to respond to emergencies [facility] immediately available    Physician(s) Dr. Florencia Reasons    Location [facility]    Staff Present Rosebud Poles, RN, BSN;Lisa Ysidro Evert, RN;Darchelle Nunes Hassell Done, MS, ACSM-CEP, Exercise Physiologist    Virtual Visit No    Medication changes reported     No    Fall or balance concerns reported    No    Tobacco Cessation No Change    Warm-up and Cool-down Performed on first and last piece of equipment    Resistance Training Performed Yes    VAD Patient? No    PAD/SET Patient? No      Pain Assessment   Currently in Pain? No/denies    Multiple Pain Sites No           Capillary Blood Glucose: No results found for this or any previous visit (from the past 24 hour(s)).    Social History   Tobacco Use  Smoking Status Former Smoker  . Packs/day: 0.25  . Years: 59.00  . Pack years: 14.75  . Types: Cigarettes  . Start date: 1961  Smokeless Tobacco Never Used  Tobacco Comment   Quit end of july 2021    Goals Met:  Proper associated with RPD/PD & O2 Sat [place] with exercise equipment Exercise tolerated well Strength training completed today  Goals Unmet:  Not Applicable  Comments: Service time is from 1015 to 1120    Dr. Fransico Him is Medical Director for Cardiac Rehab at [facility].

## 2020-07-03 ENCOUNTER — Ambulatory Visit (INDEPENDENT_AMBULATORY_CARE_PROVIDER_SITE_OTHER): Payer: Medicare Other | Admitting: Pulmonary Disease

## 2020-07-03 ENCOUNTER — Other Ambulatory Visit: Payer: Self-pay

## 2020-07-03 ENCOUNTER — Encounter (HOSPITAL_COMMUNITY)
Admission: RE | Admit: 2020-07-03 | Discharge: 2020-07-03 | Disposition: A | Discharge status: Home / Self Care | Payer: Medicare Other | Source: Ambulatory Visit | Attending: Pulmonary Disease | Admitting: Pulmonary Disease

## 2020-07-03 ENCOUNTER — Encounter: Payer: Self-pay | Admitting: Pulmonary Disease

## 2020-07-03 VITALS — Wt 162.3 lb

## 2020-07-03 DIAGNOSIS — I10 Essential (primary) hypertension: Secondary | ICD-10-CM | POA: Diagnosis not present

## 2020-07-03 DIAGNOSIS — J432 Centrilobular emphysema: Secondary | ICD-10-CM

## 2020-07-03 NOTE — Patient Instructions (Signed)
  Congratulations on quitting smoking and completing pulmonary rehab! Stay on Trelegy. We discussed signs and symptoms of COPD exacerbation for which she can call us

## 2020-07-03 NOTE — Assessment & Plan Note (Addendum)
In Pulmonary rehab, she can increase her heart rate to 40 to 90% of predicted We will send a prescription to pulmonary rehab

## 2020-07-03 NOTE — Assessment & Plan Note (Signed)
Continue on Trelegy, this is worked well for her, rinse mouth after use.  Unfortunately, she does not qualify for screening CT chest since she has smoked less than 20 pack years, we will follow with chest x-ray annually

## 2020-07-03 NOTE — Progress Notes (Signed)
   Subjective:    Patient ID: Heidi Ramirez, female    DOB: 12-Feb-1951, 70 y.o.   MRN: [id number]  HPI  70 yo smoker for follow-up of COPD quit smoking 09/2019  Hospitalization 03/19/2018  & 09/2019 for COPD exacerbation  Changed to Trelegy last OV 02/2020 -she cannot tell a difference from [facility] Pulmonary rehab - HR going up , more than 80% predicted but recovers well after a few minutes of stopping.  Heart rate 66 at rest today. She has quit smoking 8 months, still feels the urge Did not qualify for low-dose CT screening study   Significant tests/ events reviewed  Spirometry 02/2020 moderate airway obstruction, ratio 64, FEV1 1.33/52%, FVC 62%, 16% bronchodilator response 03/2018 spirometry ratio of 79, FEV1 of 60% FVC of 59%, surprisingly not much of obstruction  CT chest12/2019 lungs with no infiltrates or effusions slight peribronchial thickening aortic atherosclerosis   Review of Systems neg for any significant sore throat, dysphagia, itching, sneezing, nasal congestion or excess/ purulent secretions, fever, chills, sweats, unintended wt loss, pleuritic or exertional cp, hempoptysis, orthopnea pnd or change in chronic leg swelling. Also denies presyncope, palpitations, heartburn, abdominal pain, nausea, vomiting, diarrhea or change in bowel or urinary habits, dysuria,hematuria, rash, arthralgias, visual complaints, headache, numbness weakness or ataxia.     Objective:   Physical Exam  Gen. Pleasant, well-nourished, in no distress ENT - no thrush, no pallor/icterus,no post nasal drip Neck: No JVD, no thyromegaly, no carotid bruits Lungs: no use of accessory muscles, no dullness to percussion, clear without rales or rhonchi  Cardiovascular: Rhythm regular, heart sounds  normal, no murmurs or gallops, no peripheral edema Musculoskeletal: No deformities, no cyanosis or clubbing         Assessment & Plan:

## 2020-07-03 NOTE — Progress Notes (Signed)
Daily Session Note  Patient Details  Name: Heidi Ramirez MRN: [id number] Date of Birth: 1950/04/07 Referring Provider:   April Manson Pulmonary Rehab Walk Test from 05/04/2020 in [place]  Referring Provider Dr. Elsworth Soho      Encounter Date: 07/03/2020  Check In:  Session Check In - 07/03/20 1118      Check-In   Supervising physician immediately available to respond to emergencies [facility] immediately available    Physician(s) dr. Sherilyn Cooter    [facility]    Staff Present Maurice Small, RN, BSN;Ebenezer Mccaskey Ysidro Evert, RN;Jessica Hassell Done, MS, ACSM-CEP, Exercise Physiologist    Virtual Visit No    Medication changes reported     No    Fall or balance concerns reported    No    Tobacco Cessation No Change    Warm-up and Cool-down Performed on first and last piece of equipment    Resistance Training Performed Yes    VAD Patient? No    PAD/SET Patient? No      Pain Assessment   Currently in Pain? No/denies    Pain Score 0-No pain    Multiple Pain Sites No           Capillary Blood Glucose: No results found for this or any previous visit (from the past 24 hour(s)).   Exercise Prescription Changes - 07/03/20 1100      Response to Exercise   Blood Pressure (Admit) 120/64    Blood Pressure (Exercise) 160/70    Blood Pressure (Exit) 126/62    Heart Rate (Admit) 65 bpm    Heart Rate (Exercise) 123 bpm    Heart Rate (Exit) 52 bpm    Oxygen Saturation (Admit) 97 %    Oxygen Saturation (Exercise) 94 %    Oxygen Saturation (Exit) 94 %    Rating of Perceived Exertion (Exercise) 9    Perceived Dyspnea (Exercise) 1    Duration Continue with 30 min of aerobic exercise without signs/symptoms of physical distress.    Intensity THRR unchanged      Progression   Progression Continue to progress workloads to maintain intensity without signs/symptoms of physical distress.      Resistance Training   Training Prescription Yes     Weight blue bands    Reps 10-15    Time 10 Minutes      Treadmill   MPH 3    Grade 2.5    Minutes 15      NuStep   Level 6    SPM 80    Minutes 15    METs 4.5           Social History   Tobacco Use  Smoking Status Former Smoker  . Packs/day: 0.25  . Years: 59.00  . Pack years: 14.75  . Types: Cigarettes  . Start date: 1961  Smokeless Tobacco Never Used  Tobacco Comment   Quit end of july 2021    Goals Met:  Exercise tolerated well No report of cardiac concerns or symptoms Strength training completed today  Goals Unmet:  Not Applicable  Comments: Service time is from 1015 to 1120    Dr. Fransico Him is Medical Director for Cardiac Rehab at [facility].

## 2020-07-05 ENCOUNTER — Encounter (HOSPITAL_COMMUNITY)
Admission: RE | Admit: 2020-07-05 | Discharge: 2020-07-05 | Disposition: A | Discharge status: Home / Self Care | Payer: Medicare Other | Source: Ambulatory Visit | Attending: Pulmonary Disease | Admitting: Pulmonary Disease

## 2020-07-05 ENCOUNTER — Other Ambulatory Visit: Payer: Self-pay

## 2020-07-05 DIAGNOSIS — J432 Centrilobular emphysema: Secondary | ICD-10-CM | POA: Diagnosis not present

## 2020-07-05 NOTE — Progress Notes (Signed)
Daily Session Note  Patient Details  Name: Heidi Ramirez MRN: [id number] Date of Birth: 05/25/1950 Referring Provider:   April Manson Pulmonary Rehab Walk Test from 05/04/2020 in [place]  Referring Provider Dr. Elsworth Soho      Encounter Date: 07/05/2020  Check In:  Session Check In - 07/05/20 1016      Check-In   Supervising physician immediately available to respond to emergencies [facility] immediately available    Physician(s) Dr. Tawanna Solo    [facility]    Staff Present Rosebud Poles, RN, BSN;Lisa Ysidro Evert, RN;Mykael Trott Hassell Done, MS, ACSM-CEP, Exercise Physiologist    Virtual Visit No    Medication changes reported     No    Fall or balance concerns reported    No    Tobacco Cessation No Change    Warm-up and Cool-down Performed on first and last piece of equipment    Resistance Training Performed Yes    VAD Patient? No    PAD/SET Patient? No      Pain Assessment   Currently in Pain? No/denies    Multiple Pain Sites No           Capillary Blood Glucose: No results found for this or any previous visit (from the past 24 hour(s)).    Social History   Tobacco Use  Smoking Status Former Smoker  . Packs/day: 0.25  . Years: 59.00  . Pack years: 14.75  . Types: Cigarettes  . Start date: 1961  Smokeless Tobacco Never Used  Tobacco Comment   Quit end of july 2021    Goals Met:  Proper associated with RPD/PD & O2 Sat Improved SOB with ADL's Exercise tolerated well  Completed pulmonary rehab program  Goals Unmet:  Not Applicable  Comments: Service time is from 1015 to 1045 Completed pulmonary rehab undergrad program today and made progression with 6 minute walk test.   Dr. Fransico Him is Medical Director for Cardiac Rehab at [facility].

## 2020-07-11 DIAGNOSIS — I700 Atherosclerosis of aorta: Secondary | ICD-10-CM | POA: Diagnosis not present

## 2020-07-11 DIAGNOSIS — I491 Atrial premature depolarization: Secondary | ICD-10-CM | POA: Diagnosis not present

## 2020-07-11 DIAGNOSIS — I5032 Chronic diastolic (congestive) heart failure: Secondary | ICD-10-CM | POA: Diagnosis not present

## 2020-07-11 DIAGNOSIS — Z1211 Encounter for screening for malignant neoplasm of colon: Secondary | ICD-10-CM | POA: Diagnosis not present

## 2020-07-11 DIAGNOSIS — Z0000 Encounter for general adult medical examination without abnormal findings: Secondary | ICD-10-CM | POA: Diagnosis not present

## 2020-07-11 DIAGNOSIS — Z79899 Other long term (current) drug therapy: Secondary | ICD-10-CM | POA: Diagnosis not present

## 2020-07-11 DIAGNOSIS — E559 Vitamin D deficiency, unspecified: Secondary | ICD-10-CM | POA: Diagnosis not present

## 2020-07-11 DIAGNOSIS — J9611 Chronic respiratory failure with hypoxia: Secondary | ICD-10-CM | POA: Diagnosis not present

## 2020-07-11 DIAGNOSIS — E7800 Pure hypercholesterolemia, unspecified: Secondary | ICD-10-CM | POA: Diagnosis not present

## 2020-07-11 DIAGNOSIS — C439 Malignant melanoma of skin, unspecified: Secondary | ICD-10-CM | POA: Diagnosis not present

## 2020-07-11 DIAGNOSIS — J449 Chronic obstructive pulmonary disease, unspecified: Secondary | ICD-10-CM | POA: Diagnosis not present

## 2020-07-11 DIAGNOSIS — I10 Essential (primary) hypertension: Secondary | ICD-10-CM | POA: Diagnosis not present

## 2020-07-24 DIAGNOSIS — Z1211 Encounter for screening for malignant neoplasm of colon: Secondary | ICD-10-CM | POA: Diagnosis not present

## 2020-07-26 NOTE — Progress Notes (Signed)
Discharge Progress Report  Patient Details  Name: Heidi Ramirez MRN: [id number] Date of Birth: 08-09-1950 Referring Provider:   [facility] Walk Test from 05/04/2020 in [place]  Referring Provider Dr. Elsworth Soho       Number of Visits: 18  Reason for Discharge:  Patient reached a stable level of exercise. Patient independent in their exercise. Patient has met program and personal goals.  Smoking History:  Social History   Tobacco Use  Smoking Status Former Smoker  . Packs/day: 0.25  . Years: 59.00  . Pack years: 14.75  . Types: Cigarettes  . Start date: 1961  Smokeless Tobacco Never Used  Tobacco Comment   Quit end of july 2021    Diagnosis:  Centrilobular emphysema ([place])  ADL [facility]:  Pulmonary Assessment Scores    Row Name 05/04/20 1026 07/05/20 0753 07/05/20 1154     ADL [facility]   ADL Phase Entry Exit Exit   SOB Score total 4 12 --     CAT Score   CAT Score 4 7 --     mMRC Score   mMRC Score 1 -- 0          Initial Exercise Prescription:  Initial Exercise Prescription - 05/04/20 1400      Date of Initial Exercise RX and Referring Provider   Date 05/04/20    Referring Provider Dr. Elsworth Soho    Expected Discharge Date 07/05/20      Treadmill   MPH 2.2    Grade 0    Minutes 15      NuStep   Level 2    SPM 80    Minutes 15      Prescription Details   Frequency (times per week) 2    Duration Progress to 45 minutes of aerobic exercise without signs/symptoms of physical distress      Intensity   THRR 40-80% of Max Heartrate 61-122    Ratings of Perceived Exertion 11-13    Perceived Dyspnea 0-4      Progression   Progression Continue to progress workloads to maintain intensity without signs/symptoms of physical distress.      Resistance Training   Training Prescription Yes    Weight Blue bands    Reps 10-15           Discharge Exercise Prescription (Final Exercise Prescription Changes):   Exercise Prescription Changes - 07/03/20 1100      Response to Exercise   Blood Pressure (Admit) 120/64    Blood Pressure (Exercise) 160/70    Blood Pressure (Exit) 126/62    Heart Rate (Admit) 65 bpm    Heart Rate (Exercise) 123 bpm    Heart Rate (Exit) 52 bpm    Oxygen Saturation (Admit) 97 %    Oxygen Saturation (Exercise) 94 %    Oxygen Saturation (Exit) 94 %    Rating of Perceived Exertion (Exercise) 9    Perceived Dyspnea (Exercise) 1    Duration Continue with 30 min of aerobic exercise without signs/symptoms of physical distress.    Intensity THRR unchanged      Progression   Progression Continue to progress workloads to maintain intensity without signs/symptoms of physical distress.      Resistance Training   Training Prescription Yes    Weight blue bands    Reps 10-15    Time 10 Minutes      Treadmill   MPH 3    Grade 2.5    Minutes  15      NuStep   Level 6    SPM 80    Minutes 15    METs 4.5           Functional Capacity:  6 Minute Walk    Row Name 05/04/20 1230 07/05/20 1551       6 Minute Walk   Phase Initial Discharge    Distance 1393 feet 1600 feet    Distance % Change -- 14.86 %    Distance Feet Change -- 207 ft    Walk Time 6 minutes 6 minutes    # of Rest Breaks 0 0    MPH 2.64 3.03    METS 3.64 3.99    RPE 9 7    Perceived Dyspnea  1 0    VO2 Peak 12.74 13.97    Symptoms No No    Resting HR 78 bpm 73 bpm    Resting BP 142/72 134/56    Resting Oxygen Saturation  95 % 94 %    Exercise Oxygen Saturation  during 6 min walk 92 % 90 %    Max Ex. HR 109 bpm 114 bpm    Max Ex. BP 172/60 162/58    2 Minute Post BP 150/64 150/56         Interval HR   1 Minute HR 104 76    2 Minute HR 102 76    3 Minute HR 109 76    4 Minute HR 107 108    5 Minute HR 106 114    6 Minute HR 90 105    2 Minute Post HR 81 79    Interval Heart Rate? Yes Yes         Interval Oxygen   Interval Oxygen? Yes Yes    Baseline Oxygen Saturation % 95 % 94 %     1 Minute Oxygen Saturation % 93 % 93 %    1 Minute Liters of Oxygen 0 L 0 L    2 Minute Oxygen Saturation % 92 % 94 %    2 Minute Liters of Oxygen 0 L 0 L    3 Minute Oxygen Saturation % 93 % 92 %    3 Minute Liters of Oxygen 0 L 0 L    4 Minute Oxygen Saturation % 94 % 92 %    4 Minute Liters of Oxygen 0 L 0 L    5 Minute Oxygen Saturation % 94 % 93 %    5 Minute Liters of Oxygen 0 L 0 L    6 Minute Oxygen Saturation % 95 % 90 %    6 Minute Liters of Oxygen 0 L 0 L    2 Minute Post Oxygen Saturation % 97 % 97 %    2 Minute Post Liters of Oxygen 0 L 0 L           Psychological, QOL, Others - Outcomes: PHQ 2/9: Depression screen [facility] 2/9 07/05/2020 05/04/2020  Decreased Interest 0 0  Down, Depressed, Hopeless 0 0  PHQ - 2 Score 0 0  Altered sleeping 0 0  Tired, decreased energy 0 0  Change in appetite 0 0  Feeling bad or failure about yourself  0 0  Trouble concentrating 0 0  Moving slowly or fidgety/restless 0 0  Suicidal thoughts 0 0  PHQ-9 Score 0 -  Difficult doing work/chores Not difficult at all Not difficult at all    Quality of Life:   Personal  Goals: Goals established at orientation with interventions provided to work toward goal.  Personal Goals and Risk Factors at Admission - 05/04/20 1020      Core Components/Risk Factors/Patient Goals on Admission    Weight Management Weight Loss    Improve shortness of breath with ADL's Yes    Intervention Provide education, individualized exercise plan and daily activity instruction to help decrease symptoms of SOB with activities of daily living.    Expected Outcomes Short Term: Improve cardiorespiratory fitness to achieve a reduction of symptoms when performing ADLs;Long Term: Be able to perform more ADLs without symptoms or delay the onset of symptoms    Hypertension Yes    Intervention Provide education on lifestyle modifcations including regular physical activity/exercise, weight management, moderate sodium  restriction and increased consumption of fresh fruit, vegetables, and low fat dairy, alcohol moderation, and smoking cessation.    Expected Outcomes Long Term: Maintenance of blood pressure at goal levels.            Personal Goals Discharge:  Goals and Risk Factor Review    Row Name 05/07/20 1217 05/24/20 0920 06/25/20 1145         Core Components/Risk Factors/Patient Goals Review   Personal Goals Review Increase knowledge of respiratory medications and ability to use respiratory devices properly.;Improve shortness of breath with ADL's;Develop more efficient breathing techniques such as purse lipped breathing and diaphragmatic breathing and practicing self-pacing with activity. Increase knowledge of respiratory medications and ability to use respiratory devices properly.;Improve shortness of breath with ADL's;Develop more efficient breathing techniques such as purse lipped breathing and diaphragmatic breathing and practicing self-pacing with activity. Develop more efficient breathing techniques such as purse lipped breathing and diaphragmatic breathing and practicing self-pacing with activity.;Increase knowledge of respiratory medications and ability to use respiratory devices properly.;Improve shortness of breath with ADL's     Review Ethel will begin exercising in pulmonary rehab tomorrow, 05/08/2020.  We will assist Roisin in meeting these goals. Her attendance has been excellent, is progressing well with her exercise. Juliann Pulse graduates in 2 weeks, and has made great progress with increasing her exercise workloads.  Presently in acheiving 4.1 mets on the nustep, and 2 mph with 2.5 incline on the treadmill. Her shortness of breath is improving.     Expected Outcomes See admission goals. See admission goals. See admission goals.            Exercise Goals and Review:  Exercise Goals    Row Name 05/04/20 1017             Exercise Goals   Increase Physical Activity Yes       Intervention  Provide advice, education, support and counseling about physical activity/exercise needs.;Develop an individualized exercise prescription for aerobic and resistive training based on initial evaluation findings, risk stratification, comorbidities and participant's personal goals.       Expected Outcomes Short Term: Attend rehab on a regular basis to increase amount of physical activity.;Long Term: Add in home exercise to make exercise part of routine and to increase amount of physical activity.;Long Term: Exercising regularly at least 3-5 days a week.       Increase Strength and Stamina Yes       Intervention Provide advice, education, support and counseling about physical activity/exercise needs.;Develop an individualized exercise prescription for aerobic and resistive training based on initial evaluation findings, risk stratification, comorbidities and participant's personal goals.       Expected Outcomes Short Term: Increase workloads from initial exercise prescription  for resistance, speed, and METs.;Short Term: Perform resistance training exercises routinely during rehab and add in resistance training at home;Long Term: Improve cardiorespiratory fitness, muscular endurance and strength as measured by increased METs and functional capacity (6MWT)       Able to understand and use rate of perceived exertion (RPE) scale Yes       Intervention Provide education and explanation on how to use RPE scale       Expected Outcomes Short Term: Able to use RPE daily in rehab to express subjective intensity level;Long Term:  Able to use RPE to guide intensity level when exercising independently       Able to understand and use Dyspnea scale Yes       Intervention Provide education and explanation on how to use Dyspnea scale       Expected Outcomes Short Term: Able to use Dyspnea scale daily in rehab to express subjective sense of shortness of breath during exertion;Long Term: Able to use Dyspnea scale to guide  intensity level when exercising independently       Knowledge and understanding of Target Heart Rate Range (THRR) Yes       Intervention Provide education and explanation of THRR including how the numbers were predicted and where they are located for reference       Expected Outcomes Short Term: Able to state/look up THRR;Long Term: Able to use THRR to govern intensity when exercising independently;Short Term: Able to use daily as guideline for intensity in rehab       Understanding of Exercise Prescription Yes       Intervention Provide education, explanation, and written materials on patient's individual exercise prescription       Expected Outcomes Short Term: Able to explain program exercise prescription;Long Term: Able to explain home exercise prescription to exercise independently              Exercise Goals Re-Evaluation:  Exercise Goals Re-Evaluation    Row Name 05/08/20 0919 05/28/20 0920 06/26/20 0802         Exercise Goal Re-Evaluation   Exercise Goals Review Increase Physical Activity;Increase Strength and Stamina;Able to understand and use rate of perceived exertion (RPE) scale;Able to understand and use Dyspnea scale;Knowledge and understanding of Target Heart Rate Range (THRR);Understanding of Exercise Prescription Increase Physical Activity;Increase Strength and Stamina;Able to understand and use rate of perceived exertion (RPE) scale;Able to understand and use Dyspnea scale;Knowledge and understanding of Target Heart Rate Range (THRR);Understanding of Exercise Prescription Increase Physical Activity;Increase Strength and Stamina;Able to understand and use rate of perceived exertion (RPE) scale;Able to understand and use Dyspnea scale;Knowledge and understanding of Target Heart Rate Range (THRR);Understanding of Exercise Prescription     Comments Patient's first day of exercise is today. It is too early to see any progression. Will continue to monitor and progress as she is able.  Amire has completed 6 exercise sessions and has tolerated exercise very well. She is reaching the point of being independent with all exercises and is making progressions with workload increases and MET level increases. She is exercising at 2.7 METS on the Nustep and 4.6 METS on the treadmill. Will continue to monitor and progress as she is able. Zerline has completed 14 exercise sessions and has done very well in the program. She is scheduled to graduate in 2 weeks. Lately she has been going above THRR and we have requested a THR increase from her doctor. She has mad egreat progression with workload increases and MET increases.  She is independent with all exercises. She is exercising at 4.1 METS on level 6 on the Nustep. Will continue to monitor and progress as she is able. Will also follow up on exercise plan after graduation.     Expected Outcomes Through exercise at rehab and home the patient will decrease shortness of breath with daily activities and feel confident in carrying out an exercise regimn at home. Through exercise at rehab and home the patient will decrease shortness of breath with daily activities and feel confident in carrying out an exercise regimn at home. Through exercise at rehab and home the patient will decrease shortness of breath with daily activities and feel confident in carrying out an exercise regimn at home.            Nutrition & Weight - Outcomes:  Pre Biometrics - 05/04/20 1018      Pre Biometrics   Grip Strength 18 kg           Post Biometrics - 07/05/20 1554       Post  Biometrics   Grip Strength 17 kg           Nutrition:  Nutrition Therapy & Goals - 05/17/20 1124      Nutrition Therapy   Diet Generally Healthful      Personal Nutrition Goals   Nutrition Goal Pt to identify food quantities necessary to achieve weight loss of 4-8 lb at graduation from pulmonary rehab.    Personal Goal #2 Pt to swap out processed snacks for more healthful snacks       Intervention Plan   Intervention Prescribe, educate and counsel regarding individualized specific dietary modifications aiming towards targeted core components such as weight, hypertension, lipid management, diabetes, heart failure and other comorbidities.;Nutrition handout(s) given to patient.    Expected Outcomes Short Term Goal: A plan has been developed with personal nutrition goals set during dietitian appointment.;Long Term Goal: Adherence to prescribed nutrition plan.           Nutrition Discharge:   Education Questionnaire Score:  Knowledge Questionnaire Score - 07/05/20 0752      Knowledge Questionnaire Score   Post Score 16/18           Goals reviewed with patient; copy given to patient.

## 2020-08-11 DIAGNOSIS — Z23 Encounter for immunization: Secondary | ICD-10-CM | POA: Diagnosis not present

## 2020-09-03 IMAGING — CR DG CHEST 2V
2 series · 2 of 2 positions shown · non-contrast
Comparison: March 25, 2018 study obtained earlier in the day

CLINICAL DATA: Cough and shortness of breath

EXAM:
CHEST - 2 VIEW

[chest lat]
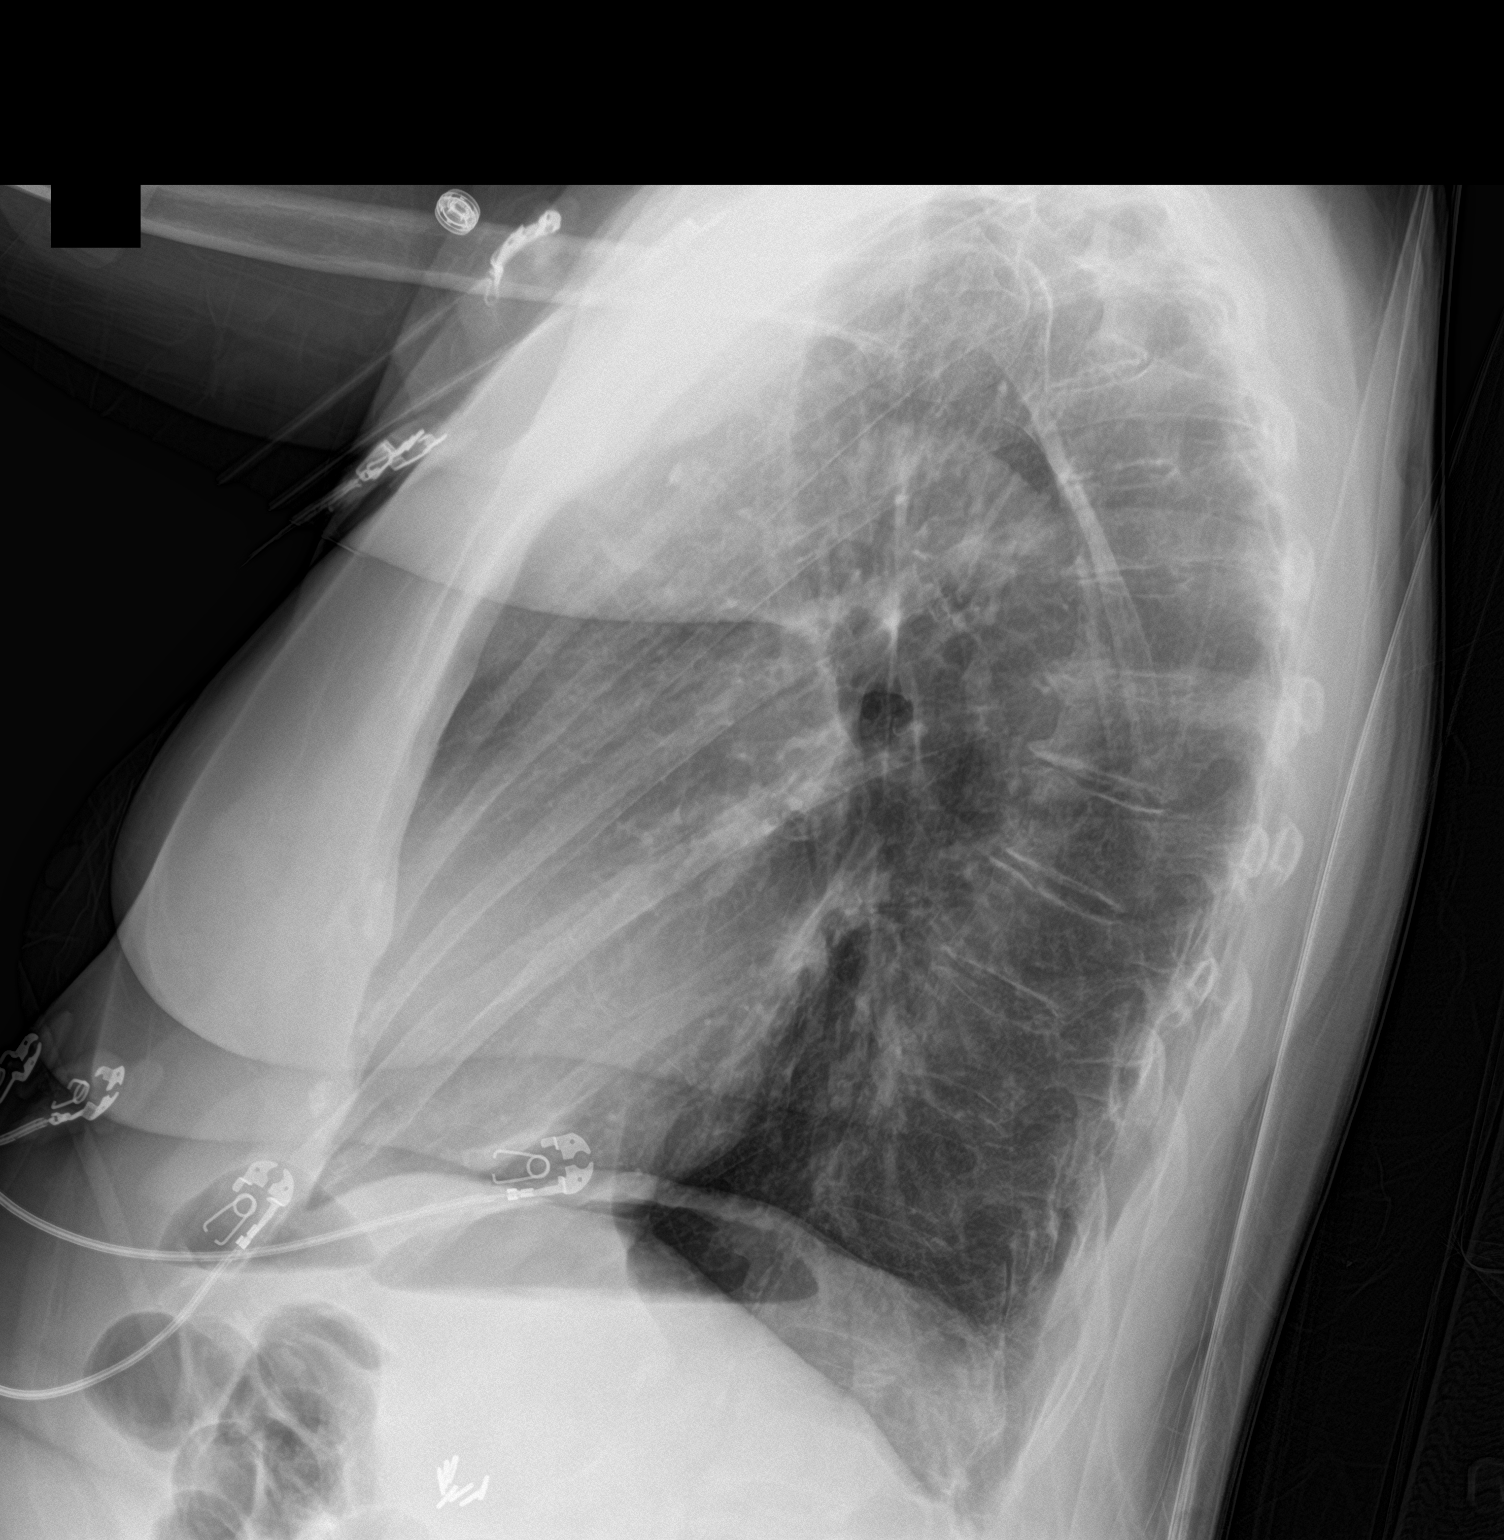

[chest ap]
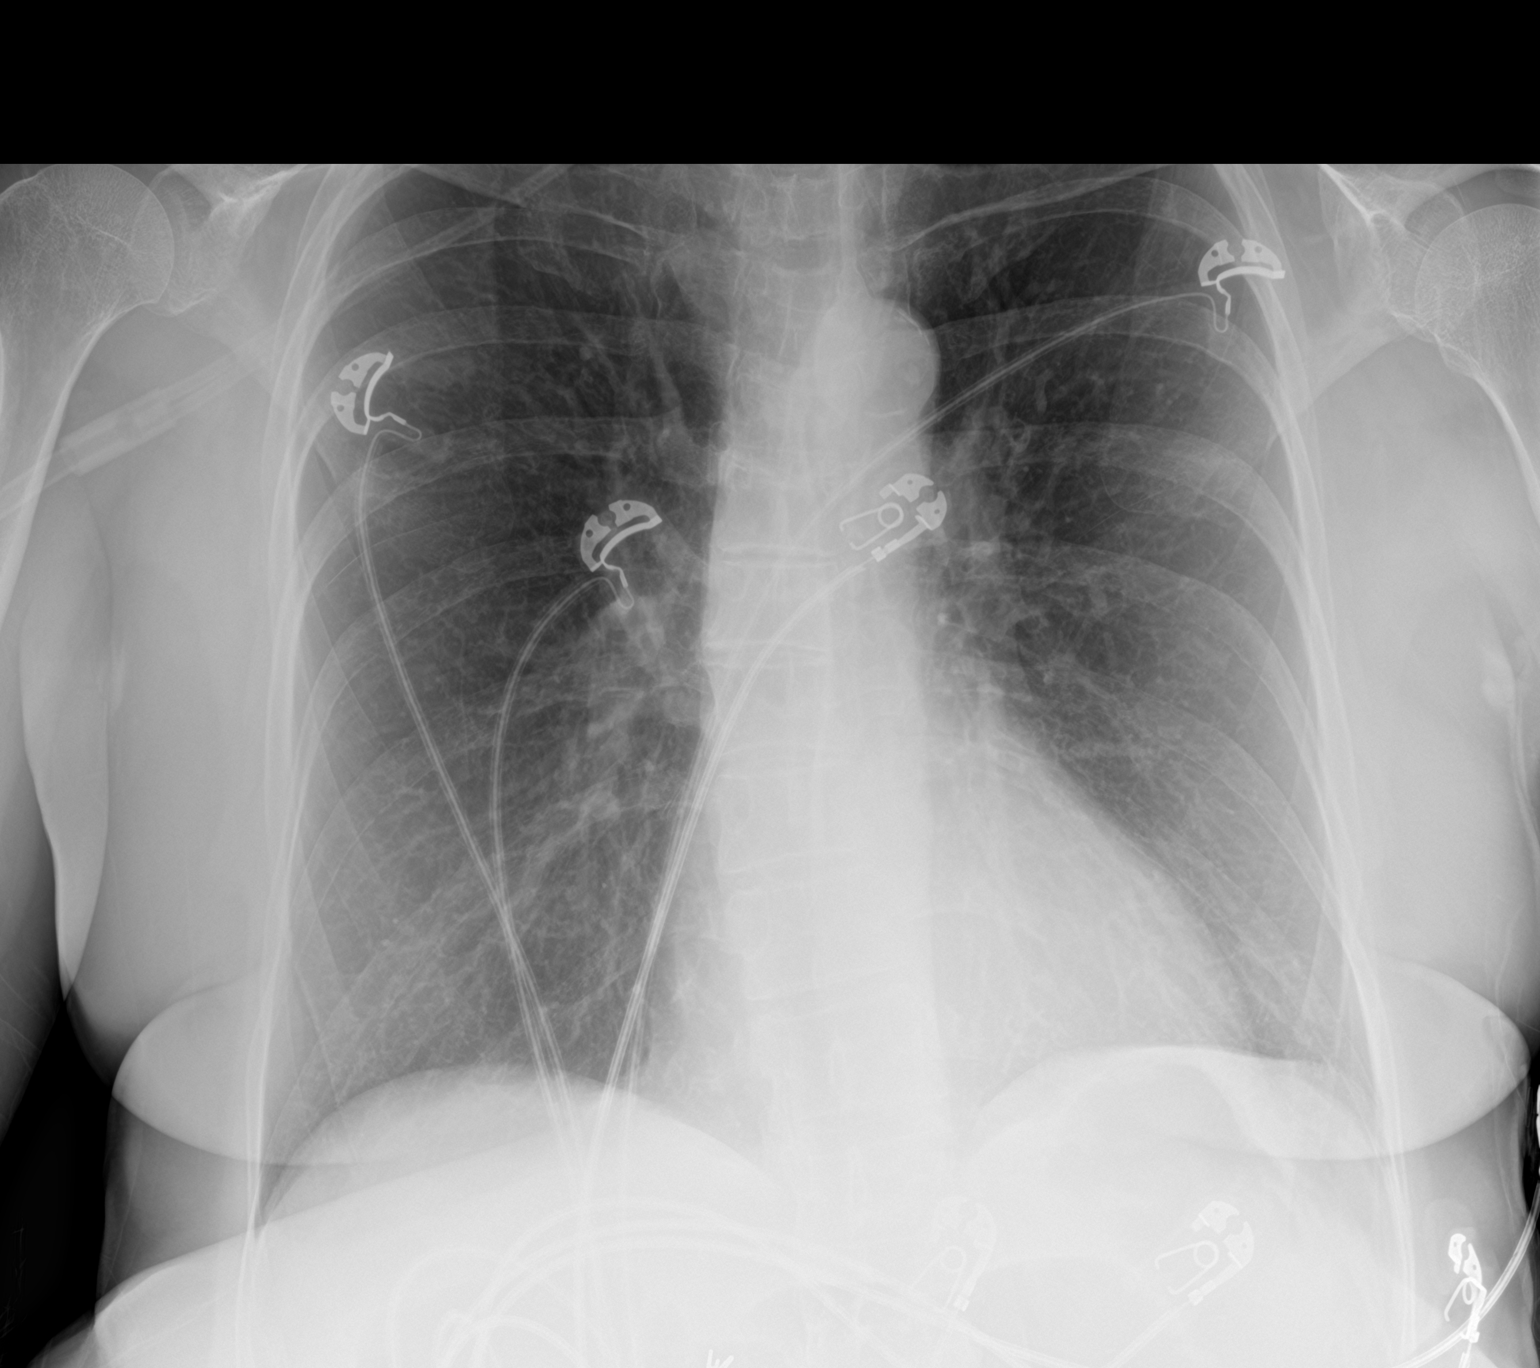

[2 of 2 positions shown; findings below may reference images not displayed]

FINDINGS: There is no edema or consolidation. Heart size and pulmonary
vascularity are normal. No adenopathy. There is aortic
atherosclerosis. No evident bone lesions.
IMPRESSION: Aortic atherosclerosis.  No edema or consolidation.

Aortic Atherosclerosis ([id number]).

## 2020-10-08 ENCOUNTER — Other Ambulatory Visit: Payer: Self-pay | Admitting: Pulmonary Disease

## 2021-01-18 DIAGNOSIS — Z23 Encounter for immunization: Secondary | ICD-10-CM | POA: Diagnosis not present

## 2021-06-09 DIAGNOSIS — Z20822 Contact with and (suspected) exposure to covid-19: Secondary | ICD-10-CM | POA: Diagnosis not present

## 2021-06-10 DIAGNOSIS — Z20822 Contact with and (suspected) exposure to covid-19: Secondary | ICD-10-CM | POA: Diagnosis not present

## 2021-06-27 DIAGNOSIS — Z1152 Encounter for screening for COVID-19: Secondary | ICD-10-CM | POA: Diagnosis not present

## 2021-06-27 DIAGNOSIS — Z20822 Contact with and (suspected) exposure to covid-19: Secondary | ICD-10-CM | POA: Diagnosis not present

## 2021-07-11 DIAGNOSIS — Z20822 Contact with and (suspected) exposure to covid-19: Secondary | ICD-10-CM | POA: Diagnosis not present

## 2021-07-27 DIAGNOSIS — Z20822 Contact with and (suspected) exposure to covid-19: Secondary | ICD-10-CM | POA: Diagnosis not present

## 2021-07-31 DIAGNOSIS — Z20822 Contact with and (suspected) exposure to covid-19: Secondary | ICD-10-CM | POA: Diagnosis not present

## 2021-07-31 DIAGNOSIS — I10 Essential (primary) hypertension: Secondary | ICD-10-CM | POA: Diagnosis not present

## 2021-07-31 DIAGNOSIS — Z0000 Encounter for general adult medical examination without abnormal findings: Secondary | ICD-10-CM | POA: Diagnosis not present

## 2021-07-31 DIAGNOSIS — E559 Vitamin D deficiency, unspecified: Secondary | ICD-10-CM | POA: Diagnosis not present

## 2021-07-31 DIAGNOSIS — Z1211 Encounter for screening for malignant neoplasm of colon: Secondary | ICD-10-CM | POA: Diagnosis not present

## 2021-07-31 DIAGNOSIS — E7800 Pure hypercholesterolemia, unspecified: Secondary | ICD-10-CM | POA: Diagnosis not present

## 2021-07-31 DIAGNOSIS — J9611 Chronic respiratory failure with hypoxia: Secondary | ICD-10-CM | POA: Diagnosis not present

## 2021-07-31 DIAGNOSIS — I5032 Chronic diastolic (congestive) heart failure: Secondary | ICD-10-CM | POA: Diagnosis not present

## 2021-07-31 DIAGNOSIS — I700 Atherosclerosis of aorta: Secondary | ICD-10-CM | POA: Diagnosis not present

## 2021-07-31 DIAGNOSIS — I517 Cardiomegaly: Secondary | ICD-10-CM | POA: Diagnosis not present

## 2021-07-31 DIAGNOSIS — I491 Atrial premature depolarization: Secondary | ICD-10-CM | POA: Diagnosis not present

## 2021-07-31 DIAGNOSIS — Z79899 Other long term (current) drug therapy: Secondary | ICD-10-CM | POA: Diagnosis not present

## 2021-07-31 DIAGNOSIS — J449 Chronic obstructive pulmonary disease, unspecified: Secondary | ICD-10-CM | POA: Diagnosis not present

## 2021-08-01 DIAGNOSIS — Z20822 Contact with and (suspected) exposure to covid-19: Secondary | ICD-10-CM | POA: Diagnosis not present

## 2021-08-05 DIAGNOSIS — Z20822 Contact with and (suspected) exposure to covid-19: Secondary | ICD-10-CM | POA: Diagnosis not present

## 2021-08-06 DIAGNOSIS — Z20828 Contact with and (suspected) exposure to other viral communicable diseases: Secondary | ICD-10-CM | POA: Diagnosis not present

## 2021-08-06 DIAGNOSIS — Z20822 Contact with and (suspected) exposure to covid-19: Secondary | ICD-10-CM | POA: Diagnosis not present

## 2021-09-15 ENCOUNTER — Other Ambulatory Visit: Payer: Self-pay | Admitting: Pulmonary Disease

## 2021-10-21 ENCOUNTER — Other Ambulatory Visit: Payer: Self-pay | Admitting: Pulmonary Disease

## 2021-10-25 ENCOUNTER — Other Ambulatory Visit: Payer: Self-pay | Admitting: Pulmonary Disease

## 2021-10-29 ENCOUNTER — Encounter: Payer: Self-pay | Admitting: Pulmonary Disease

## 2021-10-29 ENCOUNTER — Ambulatory Visit (INDEPENDENT_AMBULATORY_CARE_PROVIDER_SITE_OTHER): Payer: Medicare Other | Admitting: Pulmonary Disease

## 2021-10-29 DIAGNOSIS — J449 Chronic obstructive pulmonary disease, unspecified: Secondary | ICD-10-CM

## 2021-10-29 MED ORDER — ALBUTEROL SULFATE HFA 108 (90 BASE) MCG/ACT IN AERS: 2.0000 | INHALATION_SPRAY | Freq: Four times a day (QID) | RESPIRATORY_TRACT | 1 refills | Status: AC | PRN

## 2021-10-29 MED ORDER — TRELEGY ELLIPTA 200-62.5-25 MCG/ACT IN AEPB: 1.0000 | INHALATION_SPRAY | Freq: Every day | RESPIRATORY_TRACT | 3 refills | Status: DC

## 2021-10-29 NOTE — Progress Notes (Signed)
   Subjective:    Patient ID: Heidi Ramirez, female    DOB: 1950/06/04, 71 y.o.   MRN: [id number]  HPI  71 yo smoker for follow-up of COPD/ asthma  quit smoking 09/2019  Hospitalization 03/19/2018  & 09/2019 for COPD exacerbation  She has done well since she has been on Trelegy.  This is affordable except the last month when she hits the donut hole and has to pay the whole amount.  When the air quality was bad her breathing was poor and she had to use albuterol twice daily but this was an expired MDI and she was not sure whether this was working.  She completed pulmonary rehab She has lost about 7 pounds since last year  Significant tests/ events reviewed  Spirometry 02/2020 moderate airway obstruction, ratio 64, FEV1 1.33/52%, FVC 62%, 16% bronchodilator response 03/2018 spirometry  ratio of 79, FEV1 of 60% FVC of 59%, surprisingly not much of obstruction   CT chest 02/2018 lungs with no infiltrates or effusions slight peribronchial thickening aortic atherosclerosis   Review of Systems neg for any significant sore throat, dysphagia, itching, sneezing, nasal congestion or excess/ purulent secretions, fever, chills, sweats, unintended wt loss, pleuritic or exertional cp, hempoptysis, orthopnea pnd or change in chronic leg swelling. Also denies presyncope, palpitations, heartburn, abdominal pain, nausea, vomiting, diarrhea or change in bowel or urinary habits, dysuria,hematuria, rash, arthralgias, visual complaints, headache, numbness weakness or ataxia.     Objective:   Physical Exam  Gen. Pleasant, well-nourished, in no distress ENT - no thrush, no pallor/icterus,no post nasal drip Neck: No JVD, no thyromegaly, no carotid bruits Lungs: no use of accessory muscles, no dullness to percussion, clear without rales or rhonchi  Cardiovascular: Rhythm regular, heart sounds  normal, no murmurs or gallops, no peripheral edema Musculoskeletal: No deformities, no cyanosis or clubbing           Assessment & Plan:

## 2021-10-29 NOTE — Assessment & Plan Note (Addendum)
Much improved on Trelegy.  Refills will be provided on Trelegy and albuterol. We discussed COPD action plan and signs and symptoms of COPD exacerbation. She will call as needed  I encouraged her to stay active and offered her maintenance pulm rehab program which she would like to hold off Unfortunately she did not qualify for lung cancer screening program

## 2021-10-29 NOTE — Patient Instructions (Signed)
Refills on Trelegy and albuterol

## 2021-12-27 DIAGNOSIS — Z23 Encounter for immunization: Secondary | ICD-10-CM | POA: Diagnosis not present

## 2022-02-04 ENCOUNTER — Emergency Department (HOSPITAL_BASED_OUTPATIENT_CLINIC_OR_DEPARTMENT_OTHER)
Admission: EM | Admit: 2022-02-04 | Discharge: 2022-02-04 | Disposition: A | Discharge status: Home / Self Care | Payer: Medicare Other | Attending: Emergency Medicine | Admitting: Emergency Medicine

## 2022-02-04 ENCOUNTER — Encounter (HOSPITAL_BASED_OUTPATIENT_CLINIC_OR_DEPARTMENT_OTHER): Payer: Self-pay | Admitting: Emergency Medicine

## 2022-02-04 ENCOUNTER — Emergency Department (HOSPITAL_BASED_OUTPATIENT_CLINIC_OR_DEPARTMENT_OTHER): Payer: Medicare Other | Admitting: Radiology

## 2022-02-04 ENCOUNTER — Other Ambulatory Visit: Payer: Self-pay

## 2022-02-04 DIAGNOSIS — Z79899 Other long term (current) drug therapy: Secondary | ICD-10-CM | POA: Insufficient documentation

## 2022-02-04 DIAGNOSIS — I493 Ventricular premature depolarization: Secondary | ICD-10-CM | POA: Diagnosis not present

## 2022-02-04 DIAGNOSIS — I110 Hypertensive heart disease with heart failure: Secondary | ICD-10-CM | POA: Insufficient documentation

## 2022-02-04 DIAGNOSIS — J9811 Atelectasis: Secondary | ICD-10-CM | POA: Diagnosis not present

## 2022-02-04 DIAGNOSIS — I509 Heart failure, unspecified: Secondary | ICD-10-CM | POA: Insufficient documentation

## 2022-02-04 DIAGNOSIS — J449 Chronic obstructive pulmonary disease, unspecified: Secondary | ICD-10-CM | POA: Insufficient documentation

## 2022-02-04 DIAGNOSIS — R051 Acute cough: Secondary | ICD-10-CM | POA: Diagnosis not present

## 2022-02-04 DIAGNOSIS — R002 Palpitations: Secondary | ICD-10-CM | POA: Diagnosis present

## 2022-02-04 DIAGNOSIS — R9431 Abnormal electrocardiogram [ECG] [EKG]: Secondary | ICD-10-CM | POA: Diagnosis not present

## 2022-02-04 DIAGNOSIS — R5383 Other fatigue: Secondary | ICD-10-CM | POA: Diagnosis not present

## 2022-02-04 DIAGNOSIS — R008 Other abnormalities of heart beat: Secondary | ICD-10-CM

## 2022-02-04 DIAGNOSIS — R001 Bradycardia, unspecified: Secondary | ICD-10-CM | POA: Diagnosis not present

## 2022-02-04 DIAGNOSIS — I4519 Other right bundle-branch block: Secondary | ICD-10-CM | POA: Diagnosis not present

## 2022-02-04 DIAGNOSIS — J029 Acute pharyngitis, unspecified: Secondary | ICD-10-CM | POA: Diagnosis not present

## 2022-02-04 LAB — CBC
HCT: 44.5 % (ref 36.0–46.0)
Hemoglobin: 14.7 g/dL (ref 12.0–15.0)
MCH: 30.8 pg (ref 26.0–34.0)
MCHC: 33 g/dL (ref 30.0–36.0)
MCV: 93.3 fL (ref 80.0–100.0)
Platelets: 416 10*3/uL — ABNORMAL HIGH (ref 150–400)
RBC: 4.77 MIL/uL (ref 3.87–5.11)
RDW: 13.1 % (ref 11.5–15.5)
WBC: 12.4 10*3/uL — ABNORMAL HIGH (ref 4.0–10.5)
nRBC: 0 % (ref 0.0–0.2)

## 2022-02-04 LAB — BASIC METABOLIC PANEL
Anion gap: 11 (ref 5–15)
BUN: 13 mg/dL (ref 8–23)
CO2: 27 mmol/L (ref 22–32)
Calcium: 9.8 mg/dL (ref 8.9–10.3)
Chloride: 101 mmol/L (ref 98–111)
Creatinine, Ser: 0.84 mg/dL (ref 0.44–1.00)
GFR, Estimated: >60 mL/min (ref >60)
Glucose, Bld: 101 mg/dL — ABNORMAL HIGH (ref 70–99)
Potassium: 3.7 mmol/L (ref 3.5–5.1)
Sodium: 139 mmol/L (ref 135–145)

## 2022-02-04 LAB — TROPONIN I (HIGH SENSITIVITY)
Troponin I (High Sensitivity): 6 ng/L (ref <18)
Troponin I (High Sensitivity): 5 ng/L (ref <18)

## 2022-02-04 LAB — MAGNESIUM: Magnesium: 1.8 mg/dL (ref 1.7–2.4)

## 2022-02-04 MED ORDER — MAGNESIUM OXIDE -MG SUPPLEMENT 400 (240 MG) MG PO TABS
400.0000 mg | ORAL_TABLET | ORAL | Status: AC
  Administered 2022-02-04: 400 mg via ORAL
  Filled 2022-02-04: qty 1

## 2022-02-04 MED ORDER — POTASSIUM CHLORIDE CRYS ER 20 MEQ PO TBCR
40.0000 meq | EXTENDED_RELEASE_TABLET | Freq: Once | ORAL | Status: AC
  Administered 2022-02-04: 40 meq via ORAL
  Filled 2022-02-04: qty 2

## 2022-02-04 NOTE — ED Triage Notes (Signed)
Pt sent from PCP due to an abnormal EKG. Pt states that she has a cold and Denis any Chest pain. Pt. states that she feels ok. Pt coloring good, ambulatory and alert.

## 2022-02-04 NOTE — ED Notes (Signed)
Discharge paperwork given and verbally understood. 

## 2022-02-04 NOTE — ED Provider Notes (Signed)
[facility] EMERGENCY DEPT Provider Note   CSN: [id number] Arrival date & time: 02/04/22  1204     History {Add pertinent medical, surgical, social history, OB history to HPI:1} Chief Complaint  Patient presents with   Irregular Heart Beat    Heidi Ramirez is a 71 y.o. female.  71 year old female with a history of CHF and COPD who presents to the emergency department as referral from urgent care for bradycardia.  Per patient and chart review was in her usual state of health until 5 days ago when she started to experience sore throat, chills, fevers, nasal congestion, and fatigue.  Went to urgent care today and had a negative COVID and flu test.  Also had strep test today that was negative.  They performed a EKG which showed frequent PVCs as well as bradycardia so referred her to the emergency department for evaluation.  She denies any palpitations or syncope.  No chest pain or shortness of breath.  Says she has a history of an arrhythmia but is unsure of which type exactly.     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 Authorizing Provider  albuterol (VENTOLIN HFA) 108 (90 Base) MCG/ACT inhaler Inhale 2 puffs into the lungs every 6 (six) hours as needed for wheezing or shortness of breath. 10/29/21   Rigoberto Noel, MD  amLODipine (NORVASC) 5 MG tablet Take 1 tablet (5 mg total) by mouth daily. 10/29/19 11/28/19  Terrilee Croak, MD  cholecalciferol (VITAMIN D) 25 MCG (1000 UNIT) tablet 1 tablet    [provider]  Fluticasone-Umeclidin-Vilant (TRELEGY ELLIPTA) 200-62.5-25 MCG/ACT AEPB Inhale 1 puff into the lungs daily. 10/29/21   Rigoberto Noel, MD  Fluticasone-Umeclidin-Vilant 200-62.5-25 MCG/INH AEPB TAKE 1 PUFF BY MOUTH EVERY DAY 10/08/20   Rigoberto Noel, MD  ibuprofen (ADVIL) 200 MG  tablet 1 tablet    [provider]  Multiple Vitamins-Minerals (CENTRUM SILVER) tablet 1 tablet    [provider]  Spacer/Aero-Holding Chambers (AEROCHAMBER MV) inhaler Use as instructed Patient taking differently: 1 each by Other route as directed. 05/12/18   Martyn Ehrich, NP      Allergies    Patient has no known allergies.    Review of Systems   Review of Systems  Physical Exam Updated Vital Signs BP (!) 153/73   Pulse (!) 40   Temp 98.2 F (36.8 C)   Resp 18   Ht '5\' 6"'$  (1.676 m)   Wt 69.4 kg   SpO2 98%   BMI 24.69 kg/m  Physical Exam Vitals and nursing note reviewed.  Constitutional:      General: She is not in acute distress.    Appearance: She is well-developed.  HENT:     Head: Normocephalic and atraumatic.     Right Ear: External ear normal.     Left Ear: External ear normal.     Nose: Congestion and rhinorrhea present.  Eyes:     Extraocular Movements: Extraocular movements intact.     Conjunctiva/sclera: Conjunctivae normal.     Pupils: Pupils are equal, round, and reactive to light.  Cardiovascular:     Rate and Rhythm: Bradycardia present. Rhythm irregular.     Pulses: Normal pulses.     Heart sounds: Normal heart sounds.  No murmur heard. Pulmonary:     Effort: Pulmonary effort is normal. No respiratory distress.     Breath sounds: Normal breath sounds.  Abdominal:     General: Abdomen is flat. There is no distension.     Palpations: Abdomen is soft. There is no mass.     Tenderness: There is no abdominal tenderness. There is no guarding.  Musculoskeletal:        General: No swelling.     Cervical back: Normal range of motion and neck supple.     Right lower leg: Edema (1+) present.     Left lower leg: Edema (1+) present.  Skin:    General: Skin is warm and dry.     Capillary Refill: Capillary refill takes less than 2 seconds.  Neurological:     Mental Status: She is alert and oriented to person, place, and time. Mental  status is at baseline.  Psychiatric:        Mood and Affect: Mood normal.     ED Results / Procedures / Treatments   Labs (all labs ordered are listed, but only abnormal results are displayed) Labs Reviewed  BASIC METABOLIC PANEL - Abnormal; Notable for the following components:      Result Value   Glucose, Bld 101 (*)    All other components within normal limits  CBC - Abnormal; Notable for the following components:   WBC 12.4 (*)    Platelets 416 (*)    All other components within normal limits  MAGNESIUM  TROPONIN I (HIGH SENSITIVITY)  TROPONIN I (HIGH SENSITIVITY)    EKG None  Radiology DG Chest 2 View  Result Date: 02/04/2022 CLINICAL DATA:  Abnormal EKG EXAM: CHEST - 2 VIEW COMPARISON:  10/24/2019 FINDINGS: Normal heart size, mediastinal contours, and pulmonary vascularity. Atherosclerotic calcification aorta. Mild hyperinflation with subsegmental atelectasis at LEFT base. Lungs otherwise clear. No pulmonary infiltrate, pleural effusion, or pneumothorax. Minimal biconvex thoracic scoliosis. IMPRESSION: Mild hyperinflation with LEFT basilar subsegmental atelectasis. Aortic Atherosclerosis (ICD10-I70.0). Electronically Signed   By: Lavonia Dana M.D.   On: 02/04/2022 12:40    Procedures Procedures  {Document cardiac monitor, telemetry assessment procedure when appropriate:1}  Medications Ordered in ED Medications - No data to display  ED Course/ Medical Decision Making/ A&P Clinical Course as of 02/04/22 1610  Tue Feb 04, 2022  1234 ED EKG [WF]    Clinical Course User Index [WF] Tedd Sias, PA                           Medical Decision Making Amount and/or Complexity of Data Reviewed Labs: ordered. Radiology: ordered.   ***  {Document critical care time when appropriate:1} {Document review of labs and clinical decision tools ie heart score, Chads2Vasc2 etc:1}  {Document your independent review of radiology images, and any outside records:1} {Document  your discussion with family members, caretakers, and with consultants:1} {Document social determinants of health affecting pt's care:1} {Document your decision making why or why not admission, treatments were needed:1} Final Clinical Impression(s) / ED Diagnoses Final diagnoses:  None    Rx / DC Orders ED Discharge Orders     None

## 2022-02-04 NOTE — Discharge Instructions (Signed)
Today you were seen in the emergency department for your irregular heart rhythm.    In the emergency department you had an EKG which showed that you are having PVCs.    Follow-up with your primary doctor in 2-3 days regarding your visit.  Cardiology will call you about an appointment within 72 hours.  If you do not hear from them you may call them using the information in this packet.  Return immediately to the emergency department if you experience any of the following: Palpitations, shortness of breath, chest pain, fainting, or any other concerning symptoms.    Thank you for visiting our Emergency Department. It was a pleasure taking care of you today.

## 2022-02-04 NOTE — ED Notes (Signed)
Pt shows to be in bigeminy. Pt denis pain and states that she is familiar with same and has been in and out of same in the past. Pt denis pain and is not in distress. Md notified

## 2022-02-04 NOTE — ED Provider Triage Note (Signed)
Emergency Medicine Provider Triage Evaluation Note  Heidi Ramirez , a 71 y.o. female  was evaluated in triage.  Pt complains of URI symptoms since Friday.  She had an EKG with her PCP and they did not like how it looked and sent her to the emergency department.  History of irregular heart beat and murmur  Review of Systems  Positive: URI symptoms such as rhinorrhea and congestion. Negative: Chest pain, palpitations, shortness of breath, dizziness, lightheadedness or syncope  Physical Exam  BP (!) 153/73   Pulse (!) 40   Temp 98.2 F (36.8 C)   Resp 18   Ht '5\' 6"'$  (1.676 m)   Wt 69.4 kg   SpO2 98%   BMI 24.69 kg/m  Gen:   Awake, no distress   Resp:  Normal effort  MSK:   Moves extremities without difficulty  Other:  Regular rhythm, bradycardic  Medical Decision Making  Medically screening exam initiated at 3:23 PM.  Appropriate orders placed.  Alaja Goldinger Hubert was informed that the remainder of the evaluation will be completed by another provider, this initial triage assessment does not replace that evaluation, and the importance of remaining in the ED until their evaluation is complete.   Patient bradycardic in the 40s.  She says that her heart rate varies but this is not abnormal for her.  Not dizzy or lightheaded.  Said the only thing that bothers her is her congestion.  Back to waiting room, magnesium added to her labs   Rhae Hammock, PA-C 02/04/22 1524

## 2022-02-09 DIAGNOSIS — I4519 Other right bundle-branch block: Secondary | ICD-10-CM | POA: Diagnosis not present

## 2022-02-09 DIAGNOSIS — I493 Ventricular premature depolarization: Secondary | ICD-10-CM | POA: Diagnosis not present

## 2022-02-27 ENCOUNTER — Other Ambulatory Visit: Payer: Self-pay | Admitting: Pulmonary Disease

## 2022-02-28 DIAGNOSIS — Z03818 Encounter for observation for suspected exposure to other biological agents ruled out: Secondary | ICD-10-CM | POA: Diagnosis not present

## 2022-02-28 DIAGNOSIS — J069 Acute upper respiratory infection, unspecified: Secondary | ICD-10-CM | POA: Diagnosis not present

## 2022-02-28 DIAGNOSIS — J029 Acute pharyngitis, unspecified: Secondary | ICD-10-CM | POA: Diagnosis not present

## 2022-03-07 DIAGNOSIS — R030 Elevated blood-pressure reading, without diagnosis of hypertension: Secondary | ICD-10-CM | POA: Diagnosis not present

## 2022-03-07 DIAGNOSIS — R0981 Nasal congestion: Secondary | ICD-10-CM | POA: Diagnosis not present

## 2022-03-07 DIAGNOSIS — B370 Candidal stomatitis: Secondary | ICD-10-CM | POA: Diagnosis not present

## 2022-03-07 DIAGNOSIS — J358 Other chronic diseases of tonsils and adenoids: Secondary | ICD-10-CM | POA: Diagnosis not present

## 2022-04-04 IMAGING — DX DG CHEST 2V
2 series · 2 of 2 positions shown · non-contrast
Comparison: Chest radiograph dated 01/21/2019.

CLINICAL DATA: 68-year-old female with shortness of breath.

EXAM:
CHEST - 2 VIEW

[w chest pa]
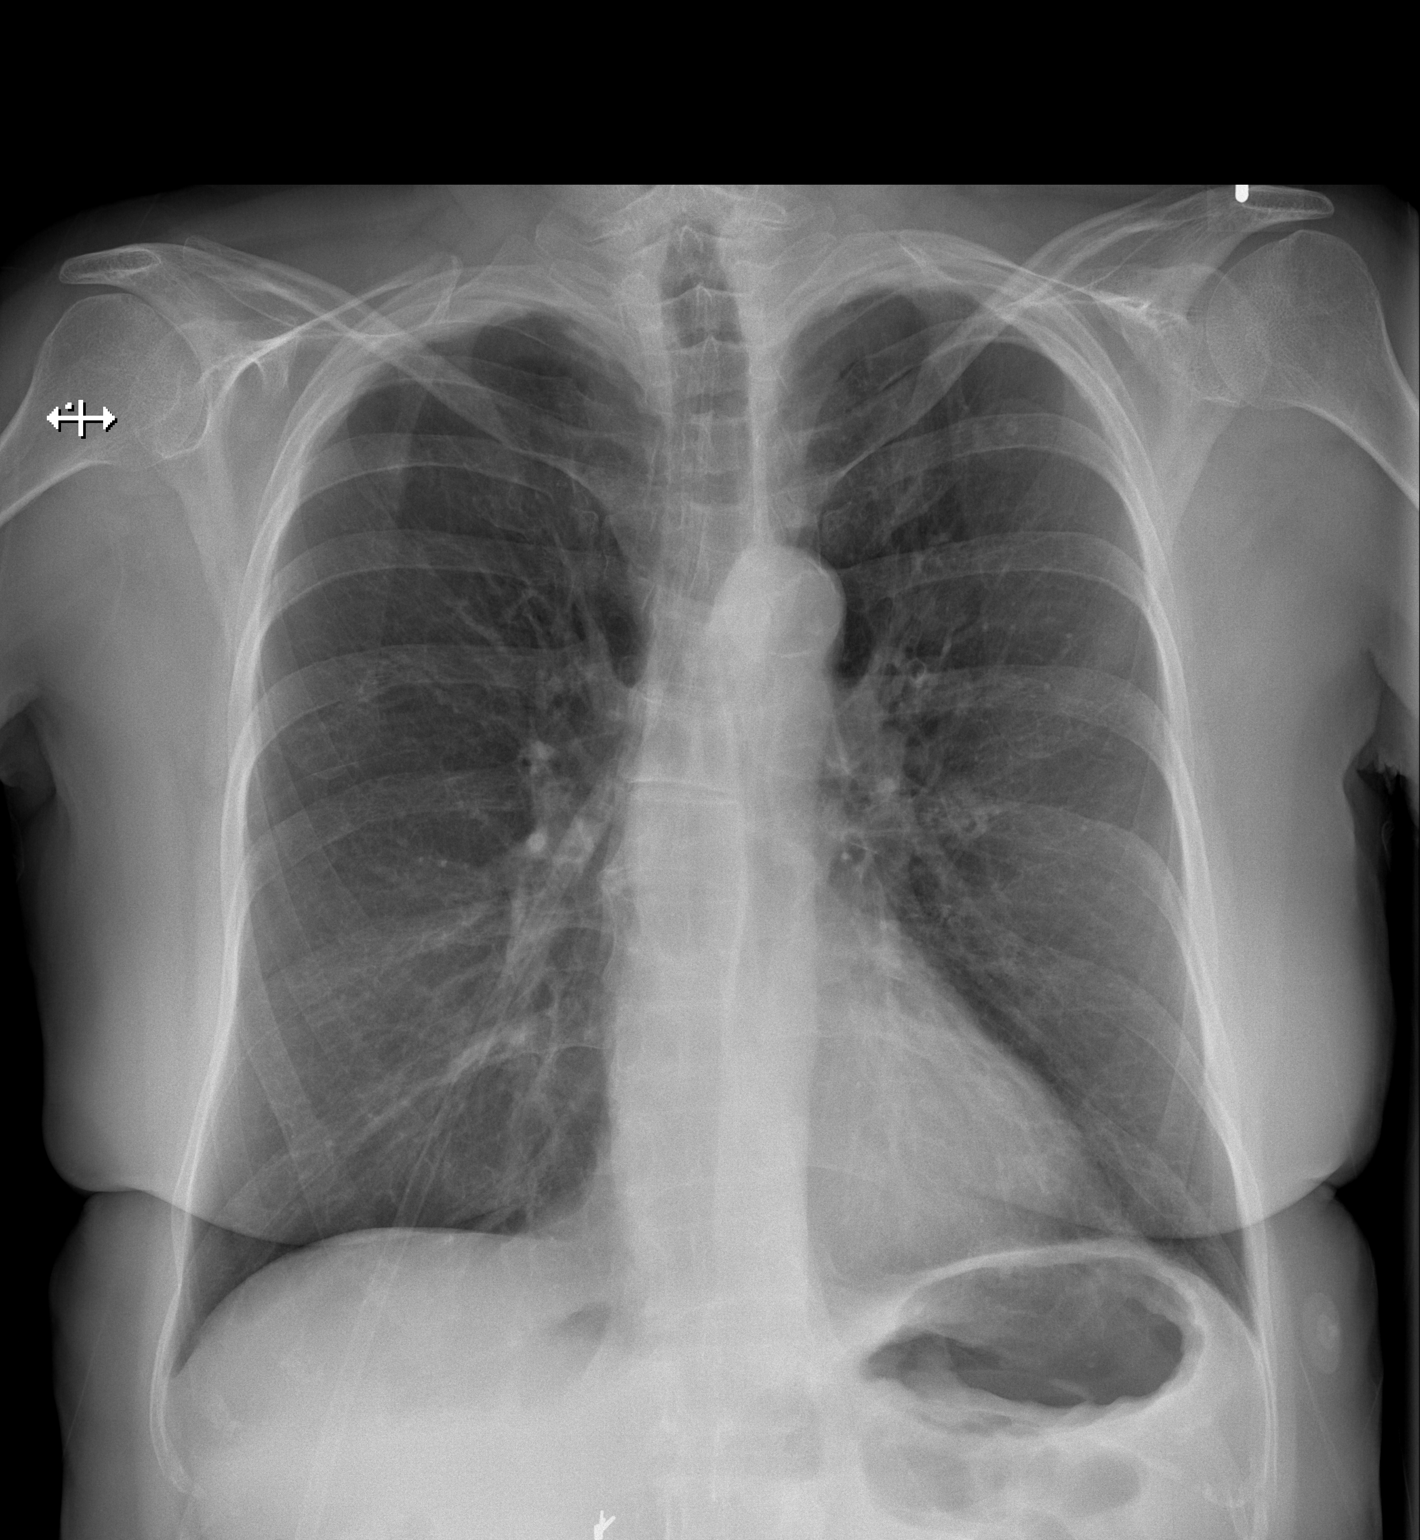

[w chest lat]
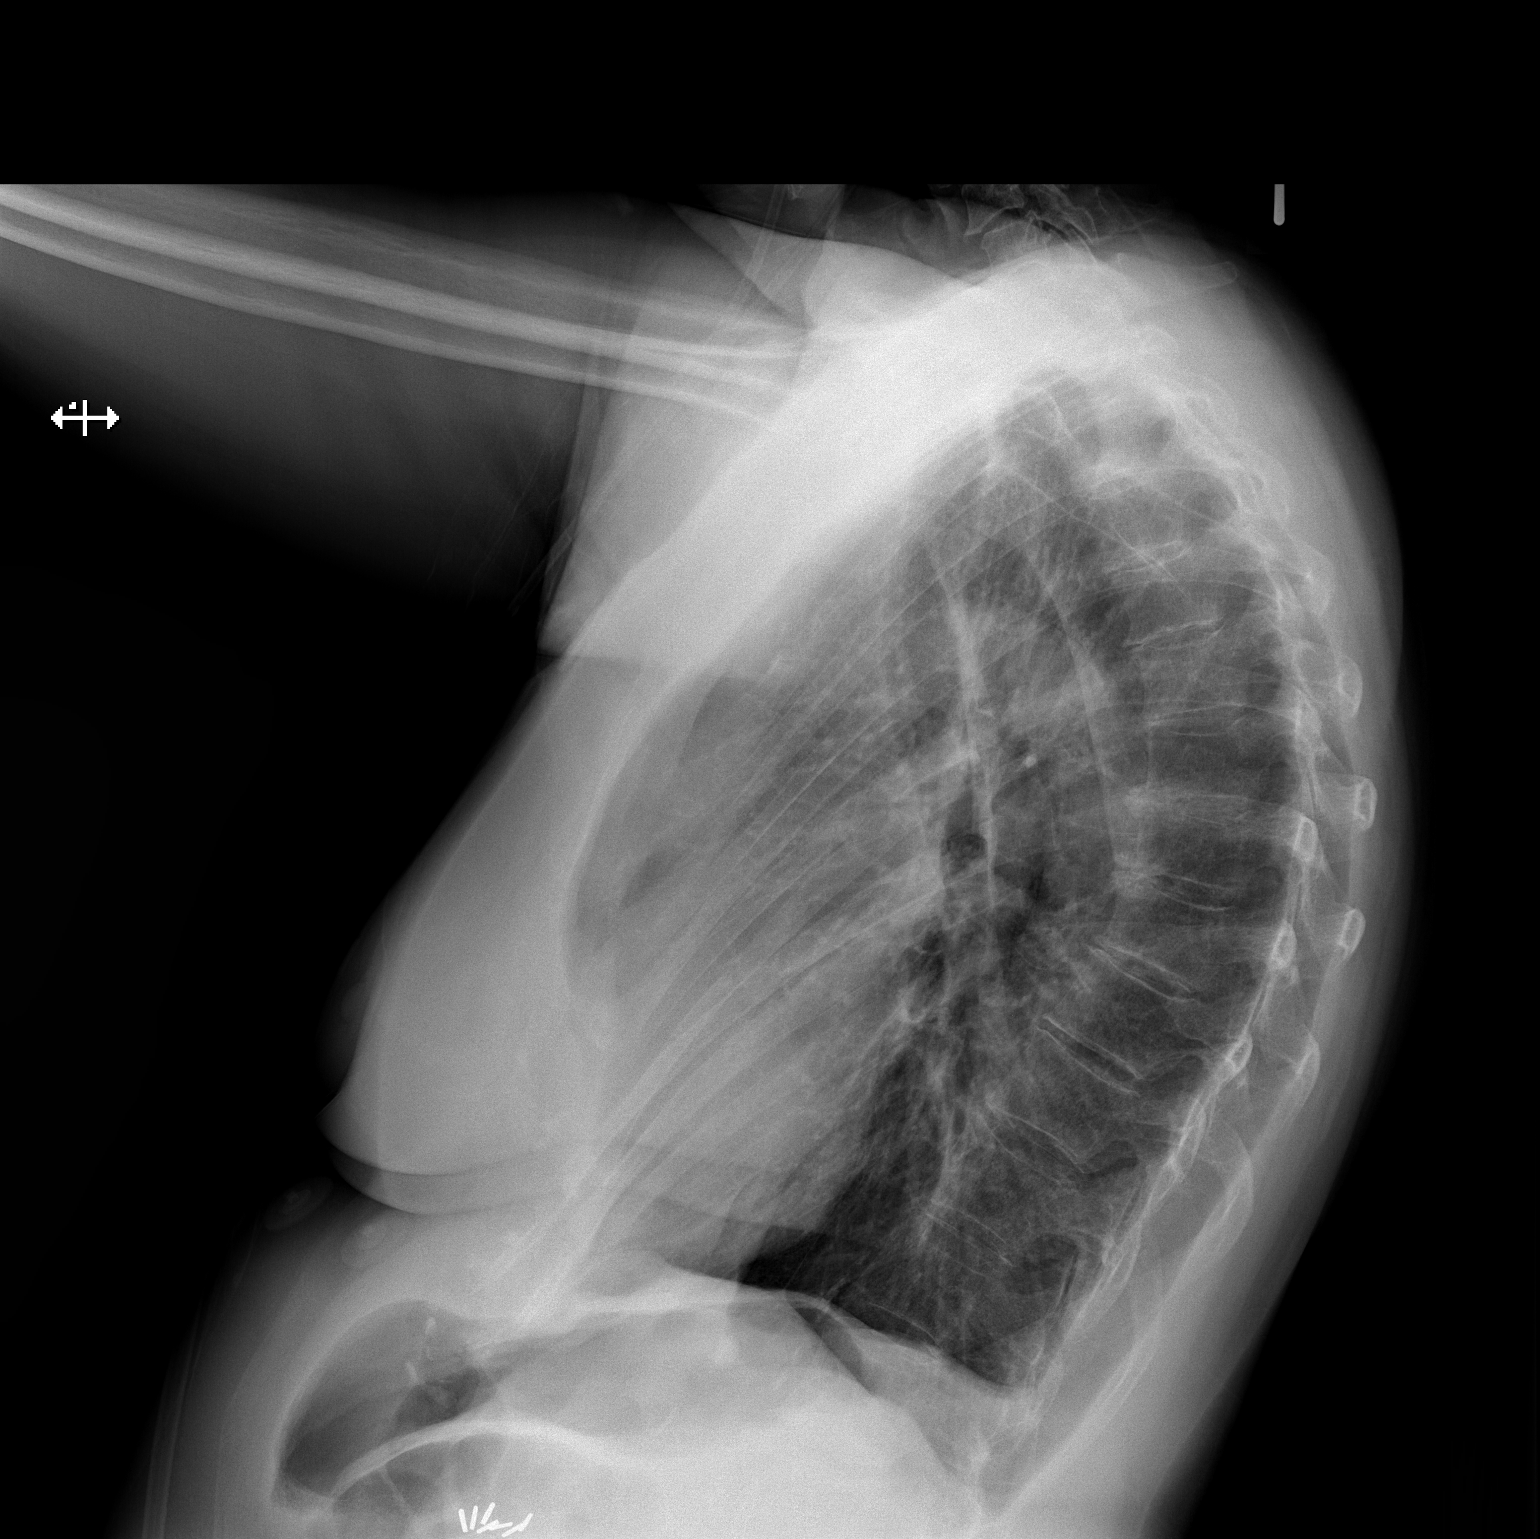

[2 of 2 positions shown; findings below may reference images not displayed]

FINDINGS: No focal consolidation, pleural effusion, or pneumothorax. There is
background of emphysema. The cardiac silhouette is within limits.
Atherosclerotic calcification of the aorta. Degenerative changes of
the spine. No acute osseous pathology.
IMPRESSION: No active cardiopulmonary disease.

## 2022-06-26 ENCOUNTER — Other Ambulatory Visit: Payer: Self-pay | Admitting: Pulmonary Disease

## 2022-07-04 DIAGNOSIS — M8589 Other specified disorders of bone density and structure, multiple sites: Secondary | ICD-10-CM | POA: Diagnosis not present

## 2022-07-04 DIAGNOSIS — Z780 Asymptomatic menopausal state: Secondary | ICD-10-CM | POA: Diagnosis not present

## 2022-07-04 DIAGNOSIS — Z1231 Encounter for screening mammogram for malignant neoplasm of breast: Secondary | ICD-10-CM | POA: Diagnosis not present

## 2022-07-09 DIAGNOSIS — R922 Inconclusive mammogram: Secondary | ICD-10-CM | POA: Diagnosis not present

## 2022-07-09 DIAGNOSIS — R928 Other abnormal and inconclusive findings on diagnostic imaging of breast: Secondary | ICD-10-CM | POA: Diagnosis not present

## 2022-07-09 DIAGNOSIS — N6002 Solitary cyst of left breast: Secondary | ICD-10-CM | POA: Diagnosis not present

## 2022-08-14 DIAGNOSIS — B370 Candidal stomatitis: Secondary | ICD-10-CM | POA: Diagnosis not present

## 2022-08-14 DIAGNOSIS — E559 Vitamin D deficiency, unspecified: Secondary | ICD-10-CM | POA: Diagnosis not present

## 2022-08-14 DIAGNOSIS — F17210 Nicotine dependence, cigarettes, uncomplicated: Secondary | ICD-10-CM | POA: Diagnosis not present

## 2022-08-14 DIAGNOSIS — R011 Cardiac murmur, unspecified: Secondary | ICD-10-CM | POA: Diagnosis not present

## 2022-08-14 DIAGNOSIS — I10 Essential (primary) hypertension: Secondary | ICD-10-CM | POA: Diagnosis not present

## 2022-08-14 DIAGNOSIS — Z23 Encounter for immunization: Secondary | ICD-10-CM | POA: Diagnosis not present

## 2022-08-14 DIAGNOSIS — J449 Chronic obstructive pulmonary disease, unspecified: Secondary | ICD-10-CM | POA: Diagnosis not present

## 2022-08-14 DIAGNOSIS — Z79899 Other long term (current) drug therapy: Secondary | ICD-10-CM | POA: Diagnosis not present

## 2022-08-14 DIAGNOSIS — Z1211 Encounter for screening for malignant neoplasm of colon: Secondary | ICD-10-CM | POA: Diagnosis not present

## 2022-08-14 DIAGNOSIS — Z0000 Encounter for general adult medical examination without abnormal findings: Secondary | ICD-10-CM | POA: Diagnosis not present

## 2022-08-14 DIAGNOSIS — F17200 Nicotine dependence, unspecified, uncomplicated: Secondary | ICD-10-CM | POA: Diagnosis not present

## 2022-08-20 DIAGNOSIS — Z1211 Encounter for screening for malignant neoplasm of colon: Secondary | ICD-10-CM | POA: Diagnosis not present

## 2022-10-17 ENCOUNTER — Encounter (HOSPITAL_BASED_OUTPATIENT_CLINIC_OR_DEPARTMENT_OTHER): Payer: Self-pay | Admitting: Pulmonary Disease

## 2022-11-18 DIAGNOSIS — E7800 Pure hypercholesterolemia, unspecified: Secondary | ICD-10-CM | POA: Diagnosis not present

## 2022-12-15 ENCOUNTER — Ambulatory Visit (HOSPITAL_BASED_OUTPATIENT_CLINIC_OR_DEPARTMENT_OTHER): Payer: Medicare Other | Admitting: Pulmonary Disease

## 2022-12-15 ENCOUNTER — Encounter (HOSPITAL_BASED_OUTPATIENT_CLINIC_OR_DEPARTMENT_OTHER): Payer: Self-pay | Admitting: Pulmonary Disease

## 2022-12-15 VITALS — BP 130/72 | HR 44 | Ht 66.0 in | Wt 155.8 lb

## 2022-12-15 DIAGNOSIS — F17200 Nicotine dependence, unspecified, uncomplicated: Secondary | ICD-10-CM | POA: Diagnosis not present

## 2022-12-15 DIAGNOSIS — J432 Centrilobular emphysema: Secondary | ICD-10-CM | POA: Diagnosis not present

## 2022-12-15 MED ORDER — FLUCONAZOLE 50 MG PO TABS: 100.0000 mg | ORAL_TABLET | Freq: Every day | ORAL | 1 refills | Status: AC

## 2022-12-15 NOTE — Assessment & Plan Note (Signed)
Heidi Ramirez is likely related to the high dose of inhaled steroid, we will trial lower dose of Trelegy 100 and if thrush persists then consider switching to Anoro but due to her prior history of asthma and good bronchodilator response would like her to get some benefit from inhaled steroid  x Rx & sample of Trelegy-100 -1 puff daily, rinse mouth after use  If thrush persists, then we can consider changing to Anoro  x refer to lung cancer screening program  Flu and RSV shots recommended

## 2022-12-15 NOTE — Assessment & Plan Note (Signed)
Smoking cessation encouraged as the most important intervention that would add years to her life. Refer to lung cancer screening program

## 2022-12-15 NOTE — Progress Notes (Signed)
Subjective:    Patient ID: Heidi Ramirez, female    DOB: 15-Mar-1951, 72 y.o.   MRN: [id number]  HPI  72 yo smoker for follow-up of COPD/ asthma  quit smoking 09/2019  Hospitalization 03/19/2018  & 09/2019 for COPD exacerbation  Annual follow-up visit. Unfortunately she has started smoking again.  A pack lasts her about 2 weeks. She reports multiple episodes of thrush over the past year.  She needed nystatin and Diflucan.  She feels sore throat and achy.  She is on Trelegy 200 -this is expensive and takes her into the donut hole by the end of the year She has not been.  Rescue albuterol much and denies nocturnal symptoms.  She also inquires about vaccinations  Significant tests/ events reviewed   Spirometry 02/2020 moderate airway obstruction, ratio 64, FEV1 1.33/52%, FVC 62%, 16% bronchodilator response 03/2018 spirometry  ratio of 79, FEV1 of 60% FVC of 59%, surprisingly not much of obstruction   CT chest 02/2018 lungs with no infiltrates or effusions slight peribronchial thickening aortic atherosclerosis  Review of Systems neg for any significant sore throat, dysphagia, itching, sneezing, nasal congestion or excess/ purulent secretions, fever, chills, sweats, unintended wt loss, pleuritic or exertional cp, hempoptysis, orthopnea pnd or change in chronic leg swelling. Also denies presyncope, palpitations, heartburn, abdominal pain, nausea, vomiting, diarrhea or change in bowel or urinary habits, dysuria,hematuria, rash, arthralgias, visual complaints, headache, numbness weakness or ataxia.     Objective:   Physical Exam  Gen. Pleasant, well-nourished, in no distress ENT - no thrush, no pallor/icterus,no post nasal drip Neck: No JVD, no thyromegaly, no carotid bruits Lungs: no use of accessory muscles, no dullness to percussion, clear without rales or rhonchi  Cardiovascular: Rhythm regular, heart sounds  normal, no murmurs or gallops, no peripheral edema Musculoskeletal: No  deformities, no cyanosis or clubbing        Assessment & Plan:

## 2022-12-15 NOTE — Patient Instructions (Signed)
x Rx & sample of Trelegy-100 -1 puff daily, rinse mouth after use  If thrush persists, then we can consider changing to Anoro  x refer to lung cancer screening program  Flu and RSV shots recommended

## 2023-01-13 ENCOUNTER — Telehealth: Payer: Self-pay | Admitting: Pulmonary Disease

## 2023-01-13 NOTE — Telephone Encounter (Signed)
Pt calling to get her prescription for Fluticasone-Umeclidin-Vilant (TRELEGY ELLIPTA) 100-62.5-25 MCG/ACT AEPB    Pharmacy: [id number] - [place], [place] - [address] AT [address] OF [facility] [address]

## 2023-01-14 MED ORDER — TRELEGY ELLIPTA 100-62.5-25 MCG/ACT IN AEPB: 1.0000 | INHALATION_SPRAY | Freq: Every day | RESPIRATORY_TRACT | 10 refills | Status: DC

## 2023-01-14 NOTE — Telephone Encounter (Signed)
Trelegy 100 sent per patient requesti Nothing further needed.

## 2023-08-20 DIAGNOSIS — Z85820 Personal history of malignant melanoma of skin: Secondary | ICD-10-CM | POA: Diagnosis not present

## 2023-08-20 DIAGNOSIS — E559 Vitamin D deficiency, unspecified: Secondary | ICD-10-CM | POA: Diagnosis not present

## 2023-08-20 DIAGNOSIS — B3781 Candidal esophagitis: Secondary | ICD-10-CM | POA: Diagnosis not present

## 2023-08-20 DIAGNOSIS — Z0000 Encounter for general adult medical examination without abnormal findings: Secondary | ICD-10-CM | POA: Diagnosis not present

## 2023-08-20 DIAGNOSIS — E7800 Pure hypercholesterolemia, unspecified: Secondary | ICD-10-CM | POA: Diagnosis not present

## 2023-08-20 DIAGNOSIS — Z79899 Other long term (current) drug therapy: Secondary | ICD-10-CM | POA: Diagnosis not present

## 2023-08-20 DIAGNOSIS — Z1211 Encounter for screening for malignant neoplasm of colon: Secondary | ICD-10-CM | POA: Diagnosis not present

## 2023-08-20 DIAGNOSIS — B370 Candidal stomatitis: Secondary | ICD-10-CM | POA: Diagnosis not present

## 2023-08-20 LAB — LAB REPORT - SCANNED
EGFR: 89
TSH: 1.04 (ref 0.41–5.90)

## 2023-08-27 DIAGNOSIS — Z1211 Encounter for screening for malignant neoplasm of colon: Secondary | ICD-10-CM | POA: Diagnosis not present

## 2023-09-29 DIAGNOSIS — Z08 Encounter for follow-up examination after completed treatment for malignant neoplasm: Secondary | ICD-10-CM | POA: Diagnosis not present

## 2023-09-29 DIAGNOSIS — Z85820 Personal history of malignant melanoma of skin: Secondary | ICD-10-CM | POA: Diagnosis not present

## 2023-09-29 DIAGNOSIS — L570 Actinic keratosis: Secondary | ICD-10-CM | POA: Diagnosis not present

## 2023-09-29 DIAGNOSIS — D2372 Other benign neoplasm of skin of left lower limb, including hip: Secondary | ICD-10-CM | POA: Diagnosis not present

## 2023-09-29 DIAGNOSIS — L578 Other skin changes due to chronic exposure to nonionizing radiation: Secondary | ICD-10-CM | POA: Diagnosis not present

## 2023-09-29 DIAGNOSIS — L821 Other seborrheic keratosis: Secondary | ICD-10-CM | POA: Diagnosis not present

## 2023-10-22 DIAGNOSIS — J029 Acute pharyngitis, unspecified: Secondary | ICD-10-CM | POA: Diagnosis not present

## 2023-10-22 DIAGNOSIS — Z8619 Personal history of other infectious and parasitic diseases: Secondary | ICD-10-CM | POA: Diagnosis not present

## 2024-02-18 ENCOUNTER — Ambulatory Visit (HOSPITAL_BASED_OUTPATIENT_CLINIC_OR_DEPARTMENT_OTHER)

## 2024-02-18 ENCOUNTER — Encounter (HOSPITAL_BASED_OUTPATIENT_CLINIC_OR_DEPARTMENT_OTHER): Payer: Self-pay | Admitting: Pulmonary Disease

## 2024-02-18 ENCOUNTER — Ambulatory Visit (INDEPENDENT_AMBULATORY_CARE_PROVIDER_SITE_OTHER): Admitting: Pulmonary Disease

## 2024-02-18 VITALS — BP 132/70 | HR 84 | Temp 97.9°F | Ht 66.0 in | Wt 159.8 lb

## 2024-02-18 DIAGNOSIS — J4489 Other specified chronic obstructive pulmonary disease: Secondary | ICD-10-CM

## 2024-02-18 DIAGNOSIS — J449 Chronic obstructive pulmonary disease, unspecified: Secondary | ICD-10-CM | POA: Diagnosis not present

## 2024-02-18 DIAGNOSIS — I10 Essential (primary) hypertension: Secondary | ICD-10-CM

## 2024-02-18 DIAGNOSIS — Z720 Tobacco use: Secondary | ICD-10-CM | POA: Diagnosis not present

## 2024-02-18 DIAGNOSIS — J439 Emphysema, unspecified: Secondary | ICD-10-CM

## 2024-02-18 DIAGNOSIS — I700 Atherosclerosis of aorta: Secondary | ICD-10-CM | POA: Diagnosis not present

## 2024-02-18 NOTE — Patient Instructions (Addendum)
  VISIT SUMMARY: Today, you came in for a follow-up visit to check on your COPD and to refill your medication. We also discussed your smoking habits and blood pressure.  YOUR PLAN: -CHRONIC OBSTRUCTIVE PULMONARY DISEASE (COPD): COPD is a chronic lung condition that makes it hard to breathe. Your COPD is well-controlled with no recent issues. You are currently using Trelegy, a triple therapy inhaler. We discussed possibly switching to Anoro, a dual therapy inhaler, if your lung function remains stable. Your Trelegy prescription has been refilled, and we checked your lung function to see if a step-down to Anoro is possible.  -TOBACCO USE: You are currently smoking 2-3 cigarettes per day, although you had previously quit for almost ten months. Your smoking history does not meet the criteria for lung cancer screening based on pack years, but we have ordered a chest x-ray to screen for lung cancer.  -HYPERTENSION: Hypertension is high blood pressure. Your blood pressure was elevated today, and you are taking amlodipine  to manage it. We rechecked your blood pressure during the visit.  INSTRUCTIONS: Please follow up with the chest x-ray as ordered. Continue taking your medications as prescribed. We will review your lung function results to determine if you can switch to Anoro. Keep monitoring your blood pressure at home and report any significant changes.                         Contains text generated by [employer].                                 Contains text generated by [employer].

## 2024-02-18 NOTE — Progress Notes (Signed)
   Subjective:    Patient ID: Heidi Ramirez Service, female    DOB: 10-18-1950, 73 y.o.   MRN: [id number]   73  yo smoker for follow-up of COPD/ asthma  quit smoking 09/2019 , then started again Hospitalization 03/19/2018  & 09/2019 for COPD exacerbation    Discussed the use of AI scribe software for clinical note transcription with the patient, who gave verbal consent to proceed.  History of Present Illness Heidi Ramirez is a 73 year old female with COPD who presents for a follow-up and medication refill.  She remains stable with no recent breathing issues, coughing, or wheezing. She is on Trelegy for COPD and is considering stepping down from this medication. No recent flare-ups or need for prednisone . She continues to smoke two to three cigarettes per day, having previously quit for almost ten months. Her smoking history does not meet the criteria for lung cancer screening based on pack years. Her blood pressure is typically high during initial readings at medical appointments, and she is on amlodipine  for hypertension.    Significant tests/ events reviewed   Spirometry 02/2020 moderate airway obstruction, ratio 64, FEV1 1.33/52%, FVC 62%, 16% bronchodilator response 03/2018 spirometry  ratio of 79, FEV1 of 60% FVC of 59%, surprisingly not much of obstruction   CT chest 02/2018 lungs with no infiltrates or effusions slight peribronchial thickening aortic atherosclerosis   Review of Systems  neg for any significant sore throat, dysphagia, itching, sneezing, nasal congestion or excess/ purulent secretions, fever, chills, sweats, unintended wt loss, pleuritic or exertional cp, hempoptysis, orthopnea pnd or change in chronic leg swelling. Also denies presyncope, palpitations, heartburn, abdominal pain, nausea, vomiting, diarrhea or change in bowel or urinary habits, dysuria,hematuria, rash, arthralgias, visual complaints, headache, numbness weakness or ataxia.      Objective:   Physical  Exam  Gen. Pleasant, well-nourished, in no distress ENT - no thrush, no pallor/icterus,no post nasal drip Neck: No JVD, no thyromegaly, no carotid bruits Lungs: no use of accessory muscles, no dullness to percussion, decreased  without rales or rhonchi  Cardiovascular: Rhythm regular, heart sounds  normal, no murmurs or gallops, no peripheral edema Musculoskeletal: No deformities, no cyanosis or clubbing         Assessment & Plan:   Assessment and Plan Assessment & Plan Chronic obstructive pulmonary disease (COPD) COPD is well-controlled with no recent exacerbations, coughing, or wheezing. Currently on Trelegy, a triple therapy inhaler. Discussed potential step-down to Anoro, a dual therapy inhaler without a steroid component, contingent on stable lung function. - Refilled Trelegy prescription - Checked lung function to assess potential step-down to Anoro  Tobacco use Currently smokes 2-3 cigarettes per day. Previously quit for 10 months. Does not meet criteria for lung cancer screening due to low pack-year history. - Ordered chest x-ray for lung cancer screening -cessation emphasized  Hypertension Blood pressure elevated today. She reports taking amlodipine . - Rechecknormal

## 2024-02-24 ENCOUNTER — Ambulatory Visit: Payer: Self-pay | Admitting: Pulmonary Disease

## 2024-03-28 ENCOUNTER — Other Ambulatory Visit: Payer: Self-pay | Admitting: Pulmonary Disease
